# Patient Record
Sex: Female | Born: 1995 | Hispanic: No | Marital: Single | Smoking: Light tobacco smoker
Health system: Southern US, Community
[De-identification: ages and names within clinical notes are randomized; demographics above are authoritative.]

## PROBLEM LIST (undated history)

## (undated) ENCOUNTER — Inpatient Hospital Stay (HOSPITAL_COMMUNITY): Payer: Self-pay

## (undated) DIAGNOSIS — Z789 Other specified health status: Secondary | ICD-10-CM

## (undated) HISTORY — PX: WRIST SURGERY: SHX841

---
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
## 2013-11-29 ENCOUNTER — Encounter (HOSPITAL_COMMUNITY): Payer: Self-pay | Admitting: *Deleted

## 2013-11-29 ENCOUNTER — Inpatient Hospital Stay (HOSPITAL_COMMUNITY)
Admission: AD | Admit: 2013-11-29 | Discharge: 2013-11-30 | Disposition: A | Discharge status: Home / Self Care | Payer: 59 | Source: Ambulatory Visit | Attending: Family Medicine | Admitting: Family Medicine

## 2013-11-29 DIAGNOSIS — O3481 Maternal care for other abnormalities of pelvic organs, first trimester: Secondary | ICD-10-CM | POA: Diagnosis present

## 2013-11-29 DIAGNOSIS — D271 Benign neoplasm of left ovary: Secondary | ICD-10-CM | POA: Diagnosis not present

## 2013-11-29 DIAGNOSIS — N760 Acute vaginitis: Secondary | ICD-10-CM | POA: Diagnosis not present

## 2013-11-29 DIAGNOSIS — R109 Unspecified abdominal pain: Secondary | ICD-10-CM | POA: Insufficient documentation

## 2013-11-29 DIAGNOSIS — B9689 Other specified bacterial agents as the cause of diseases classified elsewhere: Secondary | ICD-10-CM | POA: Diagnosis not present

## 2013-11-29 DIAGNOSIS — O26891 Other specified pregnancy related conditions, first trimester: Secondary | ICD-10-CM

## 2013-11-29 DIAGNOSIS — A5901 Trichomonal vulvovaginitis: Secondary | ICD-10-CM | POA: Diagnosis not present

## 2013-11-29 DIAGNOSIS — A599 Trichomoniasis, unspecified: Secondary | ICD-10-CM

## 2013-11-29 DIAGNOSIS — R112 Nausea with vomiting, unspecified: Secondary | ICD-10-CM | POA: Diagnosis not present

## 2013-11-29 DIAGNOSIS — R102 Pelvic and perineal pain: Secondary | ICD-10-CM

## 2013-11-29 DIAGNOSIS — Z3A01 Less than 8 weeks gestation of pregnancy: Secondary | ICD-10-CM | POA: Insufficient documentation

## 2013-11-29 HISTORY — DX: Other specified health status: Z78.9

## 2013-11-29 LAB — URINALYSIS, ROUTINE W REFLEX MICROSCOPIC
Bilirubin Urine: NEGATIVE
Glucose, UA: NEGATIVE mg/dL
Ketones, ur: NEGATIVE mg/dL
NITRITE: NEGATIVE
PROTEIN: NEGATIVE mg/dL
SPECIFIC GRAVITY, URINE: 1.02 (ref 1.005–1.030)
UROBILINOGEN UA: 0.2 mg/dL (ref 0.0–1.0)
pH: 5.5 (ref 5.0–8.0)

## 2013-11-29 LAB — URINE MICROSCOPIC-ADD ON

## 2013-11-29 LAB — CBC
HEMATOCRIT: 34.8 % — AB (ref 36.0–46.0)
HEMOGLOBIN: 12 g/dL (ref 12.0–15.0)
MCH: 29.7 pg (ref 26.0–34.0)
MCHC: 34.5 g/dL (ref 30.0–36.0)
MCV: 86.1 fL (ref 78.0–100.0)
Platelets: 232 10*3/uL (ref 150–400)
RBC: 4.04 MIL/uL (ref 3.87–5.11)
RDW: 12.7 % (ref 11.5–15.5)
WBC: 10.5 10*3/uL (ref 4.0–10.5)

## 2013-11-29 LAB — POCT PREGNANCY, URINE: PREG TEST UR: POSITIVE — AB

## 2013-11-29 NOTE — MAU Provider Note (Signed)
History     CSN: [id number]  Arrival date and time: 11/29/13 2230   First Provider Initiated Contact with Patient 11/29/13 2351      Chief Complaint  Patient presents with  . Abdominal Pain  . Nausea   HPI  [name] is a 18 y.o. female G1P0 at [redacted]w[redacted]d who presents with abdominal pain and nausea. She has had abdominal pain times 1 week; the pain is sharp and all over her abdomen. The patient did not know she was pregnant and was informed in MAU.   OB History   Grav Para Term Preterm Abortions TAB SAB Ect Mult Living   1               Past Medical History  Diagnosis Date  . Medical history non-contributory     Past Surgical History  Procedure Laterality Date  . Wrist surgery      No family history on file.  History  Substance Use Topics  . Smoking status: Never Smoker   . Smokeless tobacco: Not on file  . Alcohol Use: Yes     Comment: occassional    Allergies: Allergies not on file  No prescriptions prior to admission   Results for orders placed during the hospital encounter of 11/29/13 (from the past 48 hour(s))  URINALYSIS, ROUTINE W REFLEX MICROSCOPIC     Status: Abnormal   Collection Time    11/29/13 10:50 PM      Result Value Ref Range   Color, Urine YELLOW  YELLOW   APPearance CLEAR  CLEAR   Specific Gravity, Urine 1.020  1.005 - 1.030   pH 5.5  5.0 - 8.0   Glucose, UA NEGATIVE  NEGATIVE mg/dL   Hgb urine dipstick LARGE (*) NEGATIVE   Bilirubin Urine NEGATIVE  NEGATIVE   Ketones, ur NEGATIVE  NEGATIVE mg/dL   Protein, ur NEGATIVE  NEGATIVE mg/dL   Urobilinogen, UA 0.2  0.0 - 1.0 mg/dL   Nitrite NEGATIVE  NEGATIVE   Leukocytes, UA SMALL (*) NEGATIVE  URINE MICROSCOPIC-ADD ON     Status: None   Collection Time    11/29/13 10:50 PM      Result Value Ref Range   Squamous Epithelial / LPF RARE  RARE   WBC, UA 0-2  <3 WBC/hpf   RBC / HPF 7-10  <3 RBC/hpf   Bacteria, UA RARE  RARE  POCT PREGNANCY, URINE     Status: Abnormal   Collection  Time    11/29/13 11:36 PM      Result Value Ref Range   Preg Test, Ur POSITIVE (*) NEGATIVE   Comment:            THE SENSITIVITY OF THIS     METHODOLOGY IS >24 mIU/mL  CBC     Status: Abnormal   Collection Time    11/29/13 11:45 PM      Result Value Ref Range   WBC 10.5  4.0 - 10.5 K/uL   RBC 4.04  3.87 - 5.11 MIL/uL   Hemoglobin 12.0  12.0 - 15.0 g/dL   HCT 34.8 (*) 36.0 - 46.0 %   MCV 86.1  78.0 - 100.0 fL   MCH 29.7  26.0 - 34.0 pg   MCHC 34.5  30.0 - 36.0 g/dL   RDW 12.7  11.5 - 15.5 %   Platelets 232  150 - 400 K/uL  ABO/RH     Status: None   Collection Time    11/29/13  11:45 PM      Result Value Ref Range   ABO/RH(D) O POS    HCG, QUANTITATIVE, PREGNANCY     Status: Abnormal   Collection Time    11/29/13 11:45 PM      Result Value Ref Range   hCG, Beta Chain, Quant, S 28910 (*) <5 mIU/mL   Comment:              GEST. AGE      CONC.  (mIU/mL)       <=1 WEEK        5 - 50         2 WEEKS       50 - 500         3 WEEKS       100 - 10,000         4 WEEKS     1,000 - 30,000         5 WEEKS     3,500 - 115,000       6-8 WEEKS     12,000 - 270,000        12 WEEKS     15,000 - 220,000                FEMALE AND NON-PREGNANT FEMALE:         LESS THAN 5 mIU/mL  WET PREP, GENITAL     Status: Abnormal   Collection Time    11/29/13 11:59 PM      Result Value Ref Range   Yeast Wet Prep HPF POC NONE SEEN  NONE SEEN   Trich, Wet Prep MODERATE (*) NONE SEEN   Clue Cells Wet Prep HPF POC MODERATE (*) NONE SEEN   WBC, Wet Prep HPF POC FEW (*) NONE SEEN   Comment: MANY BACTERIA SEEN    US Ob Comp Less 14 Wks  11/30/2013   CLINICAL DATA:  Pelvic pain in first trimester pregnancy. Initial encounter  EXAM: OBSTETRIC <14 WK [place] AND TRANSVAGINAL OB US  TECHNIQUE: Both transabdominal and transvaginal ultrasound examinations were performed for complete evaluation of the gestation as well as the maternal uterus, adnexal regions, and pelvic cul-de-sac. Transvaginal technique was  performed to assess early pregnancy.  COMPARISON:  None.  FINDINGS: Intrauterine gestational sac: Visualized/normal in shape.  Yolk sac:  Present  Embryo:  Present  Cardiac Activity: Present  Heart Rate:  117 bpm  CRL:   8.6  mm   6 w 6 d                  [place] EDC: 07/20/2014  Maternal uterus/adnexae: The right ovary is normal. The left ovary contains a echogenic mass with posterior acoustic shadowing measuring up to 3 cm. No free pelvic fluid or extra ovarian mass. The neighboring ovarian parenchyma does not appear edematous/enlarged. No a subchronic hemorrhage.  IMPRESSION: 1. Single, living intrauterine gestation with estimated age 11 weeks 6 days. No adverse gestational findings. 2. 3 cm mass in the left ovary consistent with dermoid.   Electronically Signed   By: Jorje Guild M.D.   On: 11/30/2013 00:50   US Ob Transvaginal  11/30/2013   CLINICAL DATA:  Pelvic pain in first trimester pregnancy. Initial encounter  EXAM: OBSTETRIC <14 WK [place] AND TRANSVAGINAL OB US  TECHNIQUE: Both transabdominal and transvaginal ultrasound examinations were performed for complete evaluation of the gestation as well as the maternal uterus, adnexal regions, and pelvic cul-de-sac. Transvaginal technique was performed to  assess early pregnancy.  COMPARISON:  None.  FINDINGS: Intrauterine gestational sac: Visualized/normal in shape.  Yolk sac:  Present  Embryo:  Present  Cardiac Activity: Present  Heart Rate:  117 bpm  CRL:   8.6  mm   6 w 6 d                  [place] EDC: 07/20/2014  Maternal uterus/adnexae: The right ovary is normal. The left ovary contains a echogenic mass with posterior acoustic shadowing measuring up to 3 cm. No free pelvic fluid or extra ovarian mass. The neighboring ovarian parenchyma does not appear edematous/enlarged. No a subchronic hemorrhage.  IMPRESSION: 1. Single, living intrauterine gestation with estimated age 55 weeks 6 days. No adverse gestational findings. 2. 3 cm mass in the left ovary consistent with  dermoid.   Electronically Signed   By: Jorje Guild M.D.   On: 11/30/2013 00:50      Review of Systems  Constitutional: Positive for chills. Negative for fever.  Gastrointestinal: Positive for nausea and abdominal pain. Negative for vomiting, diarrhea and constipation.  Genitourinary: Negative for dysuria, urgency, frequency and hematuria.       No vaginal discharge. + vaginal bleeding; one episode of spotting yesterday.  No dysuria.    Physical Exam   Blood pressure 129/74, pulse 73, temperature 98.6 F (37 C), resp. rate 18, height 5\' 5"  (1.651 m), weight 74.571 kg (164 lb 6.4 oz), last menstrual period 10/23/2013.  Physical Exam  Constitutional: She is oriented to person, place, and time. She appears well-developed and well-nourished. No distress.  HENT:  Head: Normocephalic.  Eyes: Pupils are equal, round, and reactive to light.  Neck: Neck supple.  Respiratory: Effort normal.  GI: Soft. She exhibits no distension. There is no tenderness. There is no rebound and no guarding.  Genitourinary:  Speculum exam: Vagina - Small amount of creamy, bubbly, white discharge, no odor Cervix - No contact bleeding Bimanual exam: Cervix closed Uterus non tender, normal size Adnexa non tender, no masses bilaterally GC/Chlam, wet prep done Chaperone present for exam.   Musculoskeletal: Normal range of motion.  Neurological: She is alert and oriented to person, place, and time.  Skin: Skin is warm. She is not diaphoretic.  Psychiatric: Her behavior is normal.    MAU Course  Procedures None  MDM CBC CMP HIV ABO Wet prep GC [place]   Assessment and Plan   A: -[redacted]w[redacted]d SIUP -3 cm left ovarian dermoid cyst  -abdominal pain in pregnancy  -Trichomonas  -bacterial vaginosis   P: Discharge home in stable condition  RX: Flagyl times 7 days Return to MAU as needed, if symptoms worsen Start prenatal care ASAP  Patient needs treatment; no intercourse for 7 days once partner is  treated.   Darrelyn Hillock Rasch, NP 11/30/2013 1:01 AM

## 2013-11-29 NOTE — MAU Note (Signed)
For past wk I have felt really nauseated but can't throw up. Abdominal pains for a wk. Spotted one time yesterday.

## 2013-11-30 ENCOUNTER — Encounter (HOSPITAL_COMMUNITY): Payer: Self-pay | Admitting: *Deleted

## 2013-11-30 ENCOUNTER — Inpatient Hospital Stay (HOSPITAL_COMMUNITY): Payer: 59

## 2013-11-30 LAB — WET PREP, GENITAL: Yeast Wet Prep HPF POC: NONE SEEN

## 2013-11-30 LAB — ABO/RH: ABO/RH(D): O POS

## 2013-11-30 LAB — GC/CHLAMYDIA PROBE AMP
CT Probe RNA: NEGATIVE
GC Probe RNA: POSITIVE — AB

## 2013-11-30 LAB — HIV ANTIBODY (ROUTINE TESTING W REFLEX): HIV 1&2 Ab, 4th Generation: NONREACTIVE

## 2013-11-30 LAB — HCG, QUANTITATIVE, PREGNANCY: hCG, Beta Chain, Quant, S: 28910 m[IU]/mL — ABNORMAL HIGH (ref <5)

## 2013-11-30 MED ORDER — METRONIDAZOLE 500 MG PO TABS
500.0000 mg | ORAL_TABLET | Freq: Two times a day (BID) | ORAL | Status: DC
Start: 2013-11-30 — End: 2014-03-20

## 2013-11-30 NOTE — MAU Provider Note (Signed)
Attestation of Attending Supervision of Advanced Practitioner (PA/CNM/NP): Evaluation and management procedures were performed by the Advanced Practitioner under my supervision and collaboration.  I have reviewed the Advanced Practitioner's note and chart, and I agree with the management and plan.  Takasha Vetere, DO Attending [facility], [facility]  

## 2013-12-02 ENCOUNTER — Encounter (HOSPITAL_COMMUNITY): Payer: Self-pay | Admitting: *Deleted

## 2013-12-16 ENCOUNTER — Encounter (HOSPITAL_COMMUNITY): Payer: Self-pay | Admitting: *Deleted

## 2014-02-11 LAB — OB RESULTS CONSOLE ABO/RH: RH Type: POSITIVE

## 2014-02-11 LAB — OB RESULTS CONSOLE RPR: RPR: NONREACTIVE

## 2014-02-11 LAB — OB RESULTS CONSOLE HEPATITIS B SURFACE ANTIGEN: Hepatitis B Surface Ag: NEGATIVE

## 2014-02-11 LAB — OB RESULTS CONSOLE ANTIBODY SCREEN: Antibody Screen: NEGATIVE

## 2014-02-11 LAB — OB RESULTS CONSOLE RUBELLA ANTIBODY, IGM: RUBELLA: IMMUNE

## 2014-02-11 LAB — OB RESULTS CONSOLE GC/CHLAMYDIA
Chlamydia: NEGATIVE
Gonorrhea: NEGATIVE

## 2014-02-14 NOTE — L&D Delivery Note (Signed)
Delivery Note Patient pushed for 45 minutes after she was noted to be C/C/+2. At [place] a viable and healthy female was delivered via  (Presentation:ROA  ).  APGAR:9/9 weight  pending   Placenta status: intact Cord:  with the following complications: none  The shoulders and body easily delivered. The placenta was delivered spontaneously.  Uterus was firm after delivery with no bleeding. No vaginal or perineal lacs.   Anesthesia: Epidural  Episiotomy:  none Lacerations:  none  Est. Blood Loss (mL):  134mL   Mom to postpartum.  Baby to Couplet care / Skin to Skin.  Johm Pfannenstiel, [place] 07/18/2014, 7:00 PM

## 2014-03-20 ENCOUNTER — Inpatient Hospital Stay (HOSPITAL_COMMUNITY)
Admission: AD | Admit: 2014-03-20 | Discharge: 2014-03-20 | Disposition: A | Discharge status: Home / Self Care | Payer: Medicaid Other | Source: Ambulatory Visit | Attending: Obstetrics and Gynecology | Admitting: Obstetrics and Gynecology

## 2014-03-20 ENCOUNTER — Encounter (HOSPITAL_COMMUNITY): Payer: Self-pay | Admitting: *Deleted

## 2014-03-20 DIAGNOSIS — M545 Low back pain, unspecified: Secondary | ICD-10-CM

## 2014-03-20 DIAGNOSIS — O26892 Other specified pregnancy related conditions, second trimester: Secondary | ICD-10-CM

## 2014-03-20 DIAGNOSIS — O9989 Other specified diseases and conditions complicating pregnancy, childbirth and the puerperium: Secondary | ICD-10-CM | POA: Insufficient documentation

## 2014-03-20 DIAGNOSIS — Z3A22 22 weeks gestation of pregnancy: Secondary | ICD-10-CM | POA: Insufficient documentation

## 2014-03-20 DIAGNOSIS — Z3A23 23 weeks gestation of pregnancy: Secondary | ICD-10-CM

## 2014-03-20 DIAGNOSIS — O2692 Pregnancy related conditions, unspecified, second trimester: Secondary | ICD-10-CM

## 2014-03-20 LAB — URINALYSIS, ROUTINE W REFLEX MICROSCOPIC
Bilirubin Urine: NEGATIVE
GLUCOSE, UA: NEGATIVE mg/dL
HGB URINE DIPSTICK: NEGATIVE
Ketones, ur: 15 mg/dL — AB
LEUKOCYTES UA: NEGATIVE
NITRITE: NEGATIVE
PH: 6 (ref 5.0–8.0)
Protein, ur: NEGATIVE mg/dL
Specific Gravity, Urine: >1.03 — ABNORMAL HIGH (ref 1.005–1.030)
Urobilinogen, UA: 0.2 mg/dL (ref 0.0–1.0)

## 2014-03-20 MED ORDER — CYCLOBENZAPRINE HCL 10 MG PO TABS: 10.0000 mg | ORAL_TABLET | Freq: Every day | ORAL | Status: DC

## 2014-03-20 NOTE — Discharge Instructions (Signed)
Back Exercises Back exercises help treat and prevent back injuries. The goal of back exercises is to increase the strength of your abdominal and back muscles and the flexibility of your back. These exercises should be started when you no longer have back pain. Back exercises include:  Pelvic Tilt. Lie on your back with your knees bent. Tilt your pelvis until the lower part of your back is against the floor. Hold this position 5 to 10 sec and repeat 5 to 10 times.  Knee to Chest. Pull first 1 knee up against your chest and hold for 20 to 30 seconds, repeat this with the other knee, and then both knees. This may be done with the other leg straight or bent, whichever feels better.  Sit-Ups or Curl-Ups. Bend your knees 90 degrees. Start with tilting your pelvis, and do a partial, slow sit-up, lifting your trunk only 30 to 45 degrees off the floor. Take at least 2 to 3 seconds for each sit-up. Do not do sit-ups with your knees out straight. If partial sit-ups are difficult, simply do the above but with only tightening your abdominal muscles and holding it as directed.  Hip-Lift. Lie on your back with your knees flexed 90 degrees. Push down with your feet and shoulders as you raise your hips a couple inches off the floor; hold for 10 seconds, repeat 5 to 10 times.  Back arches. Lie on your stomach, propping yourself up on bent elbows. Slowly press on your hands, causing an arch in your low back. Repeat 3 to 5 times. Any initial stiffness and discomfort should lessen with repetition over time.  Shoulder-Lifts. Lie face down with arms beside your body. Keep hips and torso pressed to floor as you slowly lift your head and shoulders off the floor. Do not overdo your exercises, especially in the beginning. Exercises may cause you some mild back discomfort which lasts for a few minutes; however, if the pain is more severe, or lasts for more than 15 minutes, do not continue exercises until you see your caregiver.  Improvement with exercise therapy for back problems is slow.  See your caregivers for assistance with developing a proper back exercise program. Document Released: 03/10/2004 Document Revised: 04/25/2011 Document Reviewed: 12/02/2010 [facility] Patient Information 2015 [place], [place]. This information is not intended to replace advice given to you by your health care provider. Make sure you discuss any questions you have with your health care provider. Back Pain in Pregnancy Back pain during pregnancy is common. It happens in about half of all pregnancies. It is important for you and your baby that you remain active during your pregnancy.If you feel that back pain is not allowing you to remain active or sleep well, it is time to see your caregiver. Back pain may be caused by several factors related to changes during your pregnancy.Fortunately, unless you had trouble with your back before your pregnancy, the pain is likely to get better after you deliver. Low back pain usually occurs between the fifth and seventh months of pregnancy. It can, however, happen in the first couple months. Factors that increase the risk of back problems include:   Previous back problems.  Injury to your back.  Having twins or multiple births.  A chronic cough.  Stress.  Job-related repetitive motions.  Muscle or spinal disease in the back.  Family history of back problems, ruptured (herniated) discs, or osteoporosis.  Depression, anxiety, and panic attacks. CAUSES   When you are pregnant, your body  produces a hormone called relaxin. This hormonemakes the ligaments connecting the low back and pubic bones more flexible. This flexibility allows the baby to be delivered more easily. When your ligaments are loose, your muscles need to work harder to support your back. Soreness in your back can come from tired muscles. Soreness can also come from back tissues that are irritated since they are receiving less  support.  As the baby grows, it puts pressure on the nerves and blood vessels in your pelvis. This can cause back pain.  As the baby grows and gets heavier during pregnancy, the uterus pushes the stomach muscles forward and changes your center of gravity. This makes your back muscles work harder to maintain good posture. SYMPTOMS  Lumbar pain during pregnancy Lumbar pain during pregnancy usually occurs at or above the waist in the center of the back. There may be pain and numbness that radiates into your leg or foot. This is similar to low back pain experienced by non-pregnant women. It usually increases with sitting for long periods of time, standing, or repetitive lifting. Tenderness may also be present in the muscles along your upper back. Posterior pelvic pain during pregnancy Pain in the back of the pelvis is more common than lumbar pain in pregnancy. It is a deep pain felt in your side at the waistline, or across the tailbone (sacrum), or in both places. You may have pain on one or both sides. This pain can also go into the buttocks and backs of the upper thighs. Pubic and groin pain may also be present. The pain does not quickly resolve with rest, and morning stiffness may also be present. Pelvic pain during pregnancy can be brought on by most activities. A high level of fitness before and during pregnancy may or may not prevent this problem. Labor pain is usually 1 to 2 minutes apart, lasts for about 1 minute, and involves a bearing down feeling or pressure in your pelvis. However, if you are at term with the pregnancy, constant low back pain can be the beginning of early labor, and you should be aware of this. DIAGNOSIS  X-rays of the back should not be done during the first 12 to 14 weeks of the pregnancy and only when absolutely necessary during the rest of the pregnancy. MRIs do not give off radiation and are safe during pregnancy. MRIs also should only be done when absolutely  necessary. HOME CARE INSTRUCTIONS  Exercise as directed by your caregiver. Exercise is the most effective way to prevent or manage back pain. If you have a back problem, it is especially important to avoid sports that require sudden body movements. Swimming and walking are great activities.  Do not stand in one place for long periods of time.  Do not wear high heels.  Sit in chairs with good posture. Use a pillow on your lower back if necessary. Make sure your head rests over your shoulders and is not hanging forward.  Try sleeping on your side, preferably the left side, with a pillow or two between your legs. If you are sore after a night's rest, your bedmay betoo soft.Try placing a board between your mattress and box spring.  Listen to your body when lifting.If you are experiencing pain, ask for help or try bending yourknees more so you can use your leg muscles rather than your back muscles. Squat down when picking up something from the floor. Do not bend over.  Eat a healthy diet. Try to gain  weight within your caregiver's recommendations.  Use heat or cold packs 3 to 4 times a day for 15 minutes to help with the pain.  Only take over-the-counter or prescription medicines for pain, discomfort, or fever as directed by your caregiver. Sudden (acute) back pain  Use bed rest for only the most extreme, acute episodes of back pain. Prolonged bed rest over 48 hours will aggravate your condition.  Ice is very effective for acute conditions.  Put ice in a plastic bag.  Place a towel between your skin and the bag.  Leave the ice on for 10 to 20 minutes every 2 hours, or as needed.  Using heat packs for 30 minutes prior to activities is also helpful. Continued back pain See your caregiver if you have continued problems. Your caregiver can help or refer you for appropriate physical therapy. With conditioning, most back problems can be avoided. Sometimes, a more serious issue may be the  cause of back pain. You should be seen right away if new problems seem to be developing. Your caregiver may recommend:  A maternity girdle.  An elastic sling.  A back brace.  A massage therapist or acupuncture. SEEK MEDICAL CARE IF:   You are not able to do most of your daily activities, even when taking the pain medicine you were given.  You need a referral to a physical therapist or chiropractor.  You want to try acupuncture. SEEK IMMEDIATE MEDICAL CARE IF:  You develop numbness, tingling, weakness, or problems with the use of your arms or legs.  You develop severe back pain that is no longer relieved with medicines.  You have a sudden change in bowel or bladder control.  You have increasing pain in other areas of the body.  You develop shortness of breath, dizziness, or fainting.  You develop nausea, vomiting, or sweating.  You have back pain which is similar to labor pains.  You have back pain along with your water breaking or vaginal bleeding.  You have back pain or numbness that travels down your leg.  Your back pain developed after you fell.  You develop pain on one side of your back. You may have a kidney stone.  You see blood in your urine. You may have a bladder infection or kidney stone.  You have back pain with blisters. You may have shingles. Back pain is fairly common during pregnancy but should not be accepted as just part of the process. Back pain should always be treated as soon as possible. This will make your pregnancy as pleasant as possible. Document Released: 05/11/2005 Document Revised: 04/25/2011 Document Reviewed: 06/22/2010 [facility] Patient Information 2015 [place], [place]. This information is not intended to replace advice given to you by your health care provider. Make sure you discuss any questions you have with your health care provider.

## 2014-03-20 NOTE — MAU Provider Note (Signed)
History     CSN: [id number]  Arrival date and time: 03/20/14 1609   First Provider Initiated Contact with Patient 03/20/14 1833      Chief Complaint  Patient presents with  . Back Pain   HPI Comments: [place] 19 y.o. G1P0 [redacted]w[redacted]d complains of low back pains that are ongoing off and on for 2 weeks. She has used tylenol with little help. 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 She denies any sx of UTI. She has a maternity belt and wears it  Back Pain      Past Medical History  Diagnosis Date  . Medical history non-contributory     Past Surgical History  Procedure Laterality Date  . Wrist surgery      History reviewed. No pertinent family history.  History  Substance Use Topics  . Smoking status: Never Smoker   . Smokeless tobacco: Not on file  . Alcohol Use: Yes     Comment: occassional    Allergies: No Known Allergies  Prescriptions prior to admission  Medication Sig Dispense Refill Last Dose  . Prenatal Vit-Fe Fumarate-FA (PRENATAL MULTIVITAMIN) TABS tablet Take 1 tablet by mouth daily at 12 noon.   Past Week at Unknown time  . metroNIDAZOLE (FLAGYL) 500 MG tablet Take 1 tablet (500 mg total) by mouth 2 (two) times daily. (Patient not taking: Reported on 03/20/2014) 14 tablet 0     Review of Systems  Constitutional: Negative.   HENT: Negative.   Eyes: Negative.   Cardiovascular: Negative.   Gastrointestinal: Negative.   Genitourinary: Negative.   Musculoskeletal: Positive for back pain.  Skin: Negative.   Neurological: Negative.   Psychiatric/Behavioral: Negative.    Physical Exam   Blood pressure 121/70, pulse 64, resp. rate 18, height 5' 4.17" (1.63 m), weight 84.482 kg (186 lb 4 oz), last menstrual period 10/23/2013, SpO2 100 %.  Physical Exam  Constitutional: She is oriented to person, place, and time. She appears well-developed and well-nourished. No distress.  HENT:  Head: Normocephalic and  atraumatic.  Eyes: Pupils are equal, round, and reactive to light.  Musculoskeletal:  Muscle tenderness in low back regions  Neurological: She is alert and oriented to person, place, and time.  Skin: Skin is warm and dry.  Psychiatric: She has a normal mood and affect. Her behavior is normal. Judgment and thought content normal.     Results for orders placed or performed during the hospital encounter of 03/20/14 (from the past 24 hour(s))  Urinalysis, Routine w reflex microscopic     Status: Abnormal   Collection Time: 03/20/14  6:18 PM  Result Value Ref Range   Color, Urine YELLOW YELLOW   APPearance CLEAR CLEAR   Specific Gravity, Urine >1.030 (H) 1.005 - 1.030   pH 6.0 5.0 - 8.0   Glucose, UA NEGATIVE NEGATIVE mg/dL   Hgb urine dipstick NEGATIVE NEGATIVE   Bilirubin Urine NEGATIVE NEGATIVE   Ketones, ur 15 (A) NEGATIVE mg/dL   Protein, ur NEGATIVE NEGATIVE mg/dL   Urobilinogen, UA 0.2 0.0 - 1.0 mg/dL   Nitrite NEGATIVE NEGATIVE   Leukocytes, UA NEGATIVE NEGATIVE    MAU Course  Procedures  MDM  Spoke with Dr Harrington Challenger who agreed she may have Flexeril for home  Assessment and Plan   A: Low back pains in pregnancy  P: Advised to increase fluids Advised warm baths and frequent rest periods Flexeril 10 mg po qhs prn Follow up  with Dr Harrington Challenger / MAU as needed    Georgia Duff 03/20/2014, 6:55 PM

## 2014-03-20 NOTE — MAU Note (Signed)
Pt reports she has had back pain for the past two weeks. Pt reports pain radiates towards he left side. "Feels like her back gets weak and can't support her."

## 2014-04-28 ENCOUNTER — Inpatient Hospital Stay (HOSPITAL_COMMUNITY)
Admission: AD | Admit: 2014-04-28 | Discharge: 2014-04-28 | Disposition: A | Discharge status: Home / Self Care | Payer: Medicaid Other | Source: Ambulatory Visit | Attending: Obstetrics and Gynecology | Admitting: Obstetrics and Gynecology

## 2014-04-28 ENCOUNTER — Encounter (HOSPITAL_COMMUNITY): Payer: Self-pay | Admitting: *Deleted

## 2014-04-28 DIAGNOSIS — M549 Dorsalgia, unspecified: Secondary | ICD-10-CM | POA: Diagnosis not present

## 2014-04-28 DIAGNOSIS — O9989 Other specified diseases and conditions complicating pregnancy, childbirth and the puerperium: Secondary | ICD-10-CM

## 2014-04-28 DIAGNOSIS — O2343 Unspecified infection of urinary tract in pregnancy, third trimester: Secondary | ICD-10-CM | POA: Insufficient documentation

## 2014-04-28 DIAGNOSIS — O26893 Other specified pregnancy related conditions, third trimester: Secondary | ICD-10-CM

## 2014-04-28 DIAGNOSIS — Z3A28 28 weeks gestation of pregnancy: Secondary | ICD-10-CM | POA: Insufficient documentation

## 2014-04-28 DIAGNOSIS — N390 Urinary tract infection, site not specified: Secondary | ICD-10-CM

## 2014-04-28 LAB — URINE MICROSCOPIC-ADD ON

## 2014-04-28 LAB — URINALYSIS, ROUTINE W REFLEX MICROSCOPIC
Bilirubin Urine: NEGATIVE
GLUCOSE, UA: NEGATIVE mg/dL
Hgb urine dipstick: NEGATIVE
Ketones, ur: NEGATIVE mg/dL
Nitrite: POSITIVE — AB
PH: 6.5 (ref 5.0–8.0)
Protein, ur: NEGATIVE mg/dL
Specific Gravity, Urine: <1.005 — ABNORMAL LOW (ref 1.005–1.030)
Urobilinogen, UA: 0.2 mg/dL (ref 0.0–1.0)

## 2014-04-28 MED ORDER — CYCLOBENZAPRINE HCL 10 MG PO TABS: 10.0000 mg | ORAL_TABLET | Freq: Every day | ORAL | Status: DC

## 2014-04-28 MED ORDER — NITROFURANTOIN MONOHYD MACRO 100 MG PO CAPS: 100.0000 mg | ORAL_CAPSULE | Freq: Two times a day (BID) | ORAL | Status: DC

## 2014-04-28 NOTE — Discharge Instructions (Signed)

## 2014-04-28 NOTE — MAU Note (Signed)
Pt states here for pain on her left side of lower abdomen only, and all across low back. Pain x2 weeks. Called MD office and was told to come for eval. Constant pain. No abnormal vaginal discharge or bleeding.

## 2014-04-28 NOTE — MAU Note (Signed)
Urine in lab 

## 2014-04-28 NOTE — MAU Provider Note (Signed)
History     CSN: [id number]  Arrival date and time: 04/28/14 1340   None     Chief Complaint  Patient presents with  . Abdominal Pain  . Back Pain   HPI    [name] is a 19 y.o.female G1P0 at [redacted]w[redacted]d who presents with back pain that has been going on for several weeks; she was here at 22 weeks with similar symptoms. She was given flexeril and does not feel that it helps enough.   She denies vaginal bleeding or leaking fluids + fetal movement   OB History    Gravida Para Term Preterm AB TAB SAB Ectopic Multiple Living   1               Past Medical History  Diagnosis Date  . Medical history non-contributory     Past Surgical History  Procedure Laterality Date  . Wrist surgery      History reviewed. No pertinent family history.  History  Substance Use Topics  . Smoking status: Never Smoker   . Smokeless tobacco: Not on file  . Alcohol Use: Yes     Comment: occassional    Allergies: No Known Allergies  Prescriptions prior to admission  Medication Sig Dispense Refill Last Dose  . cyclobenzaprine (FLEXERIL) 10 MG tablet Take 1 tablet (10 mg total) by mouth at bedtime. 30 tablet 0 Past Month at Unknown time  . Prenatal Vit-Fe Fumarate-FA (PRENATAL MULTIVITAMIN) TABS tablet Take 1 tablet by mouth daily at 12 noon.   Past Month at Unknown time   Results for orders placed or performed during the hospital encounter of 04/28/14 (from the past 48 hour(s))  Urinalysis, Routine w reflex microscopic     Status: Abnormal   Collection Time: 04/28/14  1:44 PM  Result Value Ref Range   Color, Urine YELLOW YELLOW   APPearance HAZY (A) CLEAR   Specific Gravity, Urine <1.005 (L) 1.005 - 1.030   pH 6.5 5.0 - 8.0   Glucose, UA NEGATIVE NEGATIVE mg/dL   Hgb urine dipstick NEGATIVE NEGATIVE   Bilirubin Urine NEGATIVE NEGATIVE   Ketones, ur NEGATIVE NEGATIVE mg/dL   Protein, ur NEGATIVE NEGATIVE mg/dL   Urobilinogen, UA 0.2 0.0 - 1.0 mg/dL   Nitrite POSITIVE (A)  NEGATIVE   Leukocytes, UA SMALL (A) NEGATIVE  Urine microscopic-add on     Status: Abnormal   Collection Time: 04/28/14  1:44 PM  Result Value Ref Range   Squamous Epithelial / LPF FEW (A) RARE   WBC, UA 3-6 <3 WBC/hpf   RBC / HPF 0-2 <3 RBC/hpf   Bacteria, UA FEW (A) RARE      Review of Systems  Constitutional: Negative for fever and chills.  Gastrointestinal: Negative for abdominal pain.  Genitourinary: Positive for frequency. Negative for dysuria and urgency.  Musculoskeletal: Positive for back pain (Bilateral lower back pain ).   Physical Exam   Blood pressure 110/68, pulse 90, temperature 98.1 F (36.7 C), resp. rate 20, height 5' 4.25" (1.632 m), weight 92.534 kg (204 lb), last menstrual period 10/23/2013.  Physical Exam  Constitutional: She appears well-developed and well-nourished.  Non-toxic appearance. She does not have a sickly appearance. She does not appear ill. No distress.  HENT:  Head: Normocephalic.  Eyes: Pupils are equal, round, and reactive to light.  Neck: Neck supple.  GI: Soft. There is no CVA tenderness.  Genitourinary:  Dilation: Closed Effacement (%): Thick Cervical Position: Posterior Exam by:: Bentli Llorente  Skin: She is  not diaphoretic.   Fetal Tracing: Baseline: 145 bpm  Variability: Moderate  Accelerations: 15x15 Decelerations: quick variables  Toco: None  MAU Course  Procedures None  MDM Urine culture pending  Discussed Patient with Dr. Rogue Bussing   Assessment and Plan   A:  1. Back pain affecting pregnancy in third trimester   2. UTI (lower urinary tract infection)     P:  Discharge home in stable condition Pregnancy support belt encouraged RX: Macrobid, flexeril Return to MAU if symptoms worsen Follow up with OB as scheduled.     Lezlie Lye, NP 04/28/2014 3:43 PM

## 2014-04-29 LAB — URINE CULTURE
Colony Count: >=100000
Special Requests: NORMAL

## 2014-07-04 ENCOUNTER — Inpatient Hospital Stay (HOSPITAL_COMMUNITY)
Admission: AD | Admit: 2014-07-04 | Discharge: 2014-07-04 | Disposition: A | Discharge status: Home / Self Care | Payer: Medicaid Other | Source: Ambulatory Visit | Attending: Obstetrics and Gynecology | Admitting: Obstetrics and Gynecology

## 2014-07-04 ENCOUNTER — Encounter (HOSPITAL_COMMUNITY): Payer: Self-pay | Admitting: *Deleted

## 2014-07-04 DIAGNOSIS — O9989 Other specified diseases and conditions complicating pregnancy, childbirth and the puerperium: Secondary | ICD-10-CM | POA: Insufficient documentation

## 2014-07-04 DIAGNOSIS — Z3A37 37 weeks gestation of pregnancy: Secondary | ICD-10-CM | POA: Diagnosis not present

## 2014-07-04 DIAGNOSIS — N949 Unspecified condition associated with female genital organs and menstrual cycle: Secondary | ICD-10-CM

## 2014-07-04 DIAGNOSIS — R102 Pelvic and perineal pain: Secondary | ICD-10-CM | POA: Insufficient documentation

## 2014-07-04 DIAGNOSIS — M545 Low back pain: Secondary | ICD-10-CM | POA: Diagnosis present

## 2014-07-04 LAB — URINALYSIS, ROUTINE W REFLEX MICROSCOPIC
Bilirubin Urine: NEGATIVE
GLUCOSE, UA: NEGATIVE mg/dL
Ketones, ur: NEGATIVE mg/dL
LEUKOCYTES UA: NEGATIVE
Nitrite: NEGATIVE
Protein, ur: NEGATIVE mg/dL
SPECIFIC GRAVITY, URINE: 1.01 (ref 1.005–1.030)
UROBILINOGEN UA: 0.2 mg/dL (ref 0.0–1.0)
pH: 6 (ref 5.0–8.0)

## 2014-07-04 LAB — URINE MICROSCOPIC-ADD ON

## 2014-07-04 MED ORDER — ACETAMINOPHEN 325 MG PO TABS: 650.0000 mg | ORAL_TABLET | Freq: Once | ORAL | Status: DC

## 2014-07-04 MED ORDER — ACETAMINOPHEN 500 MG PO TABS
1000.0000 mg | ORAL_TABLET | Freq: Once | ORAL | Status: AC
  Administered 2014-07-04: 1000 mg via ORAL
  Filled 2014-07-04: qty 2

## 2014-07-04 NOTE — MAU Note (Signed)
C/o constant, lower, sharp aching back pain  for past 2 days; "very tired of being pregnant'; broke down and cried;

## 2014-07-04 NOTE — MAU Provider Note (Signed)
Chief Complaint: Back Pain   First Provider Initiated Contact with Patient 07/04/14 1812     SUBJECTIVE HPI: Penny Leonard is a 19 y.o. G1P0 at [redacted]w[redacted]d by LMP who presents with 2 day history of sharp mid low back pain. The pain is constant but waxes and wanes. It is worse in certain positions and with walking. She has had no previous similar episodes. Pain does not radiate but yesterday she had a fleeting sharp pain in left groin along with the back pain. She has not tried anything for pain relief.  Good fetal movement. No vaginal bleeding. No leakage of fluid.  Pregnancy course: Obesity; essentially uncomplicated  Past Medical History  Diagnosis Date  . Medical history non-contributory    OB History  Gravida Para Term Preterm AB SAB TAB Ectopic Multiple Living  1             # Outcome Date GA Lbr Len/2nd Weight Sex Delivery Anes PTL Lv  1 Current              Past Surgical History  Procedure Laterality Date  . Wrist surgery     History   Social History  . Marital Status: Single    Spouse Name: N/A  . Number of Children: N/A  . Years of Education: N/A   Occupational History  . Not on file.   Social History Main Topics  . Smoking status: Never Smoker   . Smokeless tobacco: Not on file  . Alcohol Use: Yes     Comment: occassional  . Drug Use: Yes    Special: Marijuana     Comment: last used 2 weeks ago  . Sexual Activity: Yes    Birth Control/ Protection: Condom   Other Topics Concern  . Not on file   Social History Narrative   No current facility-administered medications on file prior to encounter.   Current Outpatient Prescriptions on File Prior to Encounter  Medication Sig Dispense Refill  . cyclobenzaprine (FLEXERIL) 10 MG tablet Take 1 tablet (10 mg total) by mouth at bedtime. 30 tablet 0  . Prenatal Vit-Fe Fumarate-FA (PRENATAL MULTIVITAMIN) TABS tablet Take 1 tablet by mouth daily at 12 noon.    . nitrofurantoin, macrocrystal-monohydrate, (MACROBID) 100  MG capsule Take 1 capsule (100 mg total) by mouth 2 (two) times daily. (Patient not taking: Reported on 07/04/2014) 10 capsule 0   No Known Allergies  Review of Systems  Constitutional: Negative for fever and chills.  Cardiovascular: Negative for chest pain.  Gastrointestinal: Negative for nausea and vomiting.  Genitourinary: Negative for dysuria, urgency, frequency, hematuria and flank pain.  Neurological: Negative for headaches.    OBJECTIVE Filed Vitals:   07/04/14 1921  BP: 132/83  Pulse: 97  Temp: 97.9 F (36.6 C)  TempSrc: Oral  Resp: 16   GENERAL: Obese female in no acute distress.  HEART: normal rate RESP: normal effort GI: Abdomen soft, abd and groin regions non-tender. S=D BACK: neg CVAT; minimal TTP L-S paraspinous region MS: Nontender, no edema NEURO: Alert and oriented SVE: posterior/L/C/H   EFM: Baseline 140, moderate variability, accelerations present, no decelerations Toco: No contractions  LAB RESULTS Results for orders placed or performed during the hospital encounter of 07/04/14 (from the past 24 hour(s))  Urinalysis, Routine w reflex microscopic     Status: Abnormal   Collection Time: 07/04/14  5:41 PM  Result Value Ref Range   Color, Urine YELLOW YELLOW   APPearance CLEAR CLEAR   Specific Gravity, Urine  1.010 1.005 - 1.030   pH 6.0 5.0 - 8.0   Glucose, UA NEGATIVE NEGATIVE mg/dL   Hgb urine dipstick SMALL (A) NEGATIVE   Bilirubin Urine NEGATIVE NEGATIVE   Ketones, ur NEGATIVE NEGATIVE mg/dL   Protein, ur NEGATIVE NEGATIVE mg/dL   Urobilinogen, UA 0.2 0.0 - 1.0 mg/dL   Nitrite NEGATIVE NEGATIVE   Leukocytes, UA NEGATIVE NEGATIVE  Urine microscopic-add on     Status: Abnormal   Collection Time: 07/04/14  5:41 PM  Result Value Ref Range   Squamous Epithelial / LPF RARE RARE   WBC, UA 0-2 <3 WBC/hpf   RBC / HPF 3-6 <3 RBC/hpf   Bacteria, UA FEW (A) RARE    IMAGING No results found.  MAU COURSE Acetaminophen 1000mg  po > pain relieved  rates 1/10 C/W Dr. Rogue Bussing who agrees with plan  ASSESSMENT 1. Round ligament pain   G1 at [redacted]w[redacted]d Reactive EFM  PLAN Discharge home with information on RLP relief measures Tylenol up to 3 gm/d prn   Medication List    STOP taking these medications        cyclobenzaprine 10 MG tablet  Commonly known as:  FLEXERIL     nitrofurantoin (macrocrystal-monohydrate) 100 MG capsule  Commonly known as:  MACROBID      TAKE these medications        calcium carbonate 500 MG chewable tablet  Commonly known as:  TUMS - dosed in mg elemental calcium  Chew 2 tablets by mouth daily as needed for indigestion or heartburn.     prenatal multivitamin Tabs tablet  Take 1 tablet by mouth daily at 12 noon.       Follow-up Information    Follow up with Allyn Kenner, DO On 07/09/2014.   Specialty:  Obstetrics and Gynecology   Why:  Keep your scheduled prenatal appointment   Contact information:   [place] [address] [phone]       Lorene Dy, [place]ta [place] 07/04/2014  6:25 PM

## 2014-07-04 NOTE — MAU Note (Signed)
Consistent pain in low back for past 2 days. No bleeding or leaking. Has not been dilated. Denies problems with pregnancy

## 2014-07-04 NOTE — Discharge Instructions (Signed)

## 2014-07-08 LAB — OB RESULTS CONSOLE GBS: STREP GROUP B AG: NEGATIVE

## 2014-07-17 ENCOUNTER — Encounter (HOSPITAL_COMMUNITY): Payer: Self-pay | Admitting: *Deleted

## 2014-07-17 ENCOUNTER — Inpatient Hospital Stay (HOSPITAL_COMMUNITY)
Admission: AD | Admit: 2014-07-17 | Discharge: 2014-07-17 | Disposition: A | Discharge status: Home / Self Care | Payer: Medicaid Other | Source: Ambulatory Visit | Attending: Obstetrics and Gynecology | Admitting: Obstetrics and Gynecology

## 2014-07-17 NOTE — Progress Notes (Addendum)
Contractions every 3-5 minutes. Denies bright red vaginal bleeding.   Positive fetal movement DeniesSROM/LOF  Denies any infections/complications of pregnancy  GBS negative per patient No Prenatal record on chart since 3/18

## 2014-07-18 ENCOUNTER — Inpatient Hospital Stay (HOSPITAL_COMMUNITY): Payer: Medicaid Other | Admitting: Anesthesiology

## 2014-07-18 ENCOUNTER — Encounter (HOSPITAL_COMMUNITY): Payer: Self-pay

## 2014-07-18 ENCOUNTER — Inpatient Hospital Stay (HOSPITAL_COMMUNITY)
Admission: AD | Admit: 2014-07-18 | Discharge: 2014-07-20 | DRG: 775 | Disposition: A | Discharge status: Home / Self Care | Payer: Medicaid Other | Source: Ambulatory Visit | Attending: Obstetrics & Gynecology | Admitting: Obstetrics & Gynecology

## 2014-07-18 DIAGNOSIS — IMO0001 Reserved for inherently not codable concepts without codable children: Secondary | ICD-10-CM

## 2014-07-18 DIAGNOSIS — Z3A40 40 weeks gestation of pregnancy: Secondary | ICD-10-CM | POA: Diagnosis present

## 2014-07-18 DIAGNOSIS — F1210 Cannabis abuse, uncomplicated: Secondary | ICD-10-CM | POA: Diagnosis present

## 2014-07-18 DIAGNOSIS — O99324 Drug use complicating childbirth: Secondary | ICD-10-CM | POA: Diagnosis present

## 2014-07-18 LAB — CBC
HCT: 34.8 % — ABNORMAL LOW (ref 36.0–46.0)
HEMOGLOBIN: 10.9 g/dL — AB (ref 12.0–15.0)
MCH: 26.1 pg (ref 26.0–34.0)
MCHC: 31.3 g/dL (ref 30.0–36.0)
MCV: 83.3 fL (ref 78.0–100.0)
PLATELETS: 234 10*3/uL (ref 150–400)
RBC: 4.18 MIL/uL (ref 3.87–5.11)
RDW: 15.4 % (ref 11.5–15.5)
WBC: 15.4 10*3/uL — AB (ref 4.0–10.5)

## 2014-07-18 LAB — TYPE AND SCREEN
ABO/RH(D): O POS
Antibody Screen: NEGATIVE

## 2014-07-18 MED ORDER — SIMETHICONE 80 MG PO CHEW: 80.0000 mg | CHEWABLE_TABLET | ORAL | Status: DC | PRN

## 2014-07-18 MED ORDER — DIBUCAINE 1 % RE OINT: 1.0000 "application " | TOPICAL_OINTMENT | RECTAL | Status: DC | PRN

## 2014-07-18 MED ORDER — PRENATAL MULTIVITAMIN CH
1.0000 | ORAL_TABLET | Freq: Every day | ORAL | Status: DC
  Administered 2014-07-19 – 2014-07-20 (×2): 1 via ORAL
  Filled 2014-07-18 (×2): qty 1

## 2014-07-18 MED ORDER — BENZOCAINE-MENTHOL 20-0.5 % EX AERO: 1.0000 "application " | INHALATION_SPRAY | CUTANEOUS | Status: DC | PRN

## 2014-07-18 MED ORDER — OXYTOCIN 40 UNITS IN LACTATED RINGERS INFUSION - SIMPLE MED: 62.5000 mL/h | INTRAVENOUS | Status: DC | PRN

## 2014-07-18 MED ORDER — OXYTOCIN BOLUS FROM INFUSION: 500.0000 mL | INTRAVENOUS | Status: DC

## 2014-07-18 MED ORDER — CITRIC ACID-SODIUM CITRATE 334-500 MG/5ML PO SOLN: 30.0000 mL | ORAL | Status: DC | PRN

## 2014-07-18 MED ORDER — FLEET ENEMA 7-19 GM/118ML RE ENEM: 1.0000 | ENEMA | RECTAL | Status: DC | PRN

## 2014-07-18 MED ORDER — LACTATED RINGERS IV SOLN
500.0000 mL | INTRAVENOUS | Status: DC | PRN
  Administered 2014-07-18: 500 mL via INTRAVENOUS

## 2014-07-18 MED ORDER — DIPHENHYDRAMINE HCL 50 MG/ML IJ SOLN: 12.5000 mg | INTRAMUSCULAR | Status: DC | PRN

## 2014-07-18 MED ORDER — ACETAMINOPHEN 325 MG PO TABS: 650.0000 mg | ORAL_TABLET | ORAL | Status: DC | PRN

## 2014-07-18 MED ORDER — LIDOCAINE HCL (PF) 1 % IJ SOLN
INTRAMUSCULAR | Status: DC | PRN
  Administered 2014-07-18 (×2): 8 mL

## 2014-07-18 MED ORDER — LACTATED RINGERS IV SOLN
INTRAVENOUS | Status: DC
  Administered 2014-07-18 (×3): via INTRAVENOUS

## 2014-07-18 MED ORDER — BUTORPHANOL TARTRATE 1 MG/ML IJ SOLN
1.0000 mg | INTRAMUSCULAR | Status: DC | PRN
  Administered 2014-07-18 (×2): 1 mg via INTRAVENOUS
  Filled 2014-07-18 (×2): qty 1

## 2014-07-18 MED ORDER — ONDANSETRON HCL 4 MG PO TABS: 4.0000 mg | ORAL_TABLET | ORAL | Status: DC | PRN

## 2014-07-18 MED ORDER — TETANUS-DIPHTH-ACELL PERTUSSIS 5-2.5-18.5 LF-MCG/0.5 IM SUSP: 0.5000 mL | Freq: Once | INTRAMUSCULAR | Status: DC

## 2014-07-18 MED ORDER — OXYTOCIN 40 UNITS IN LACTATED RINGERS INFUSION - SIMPLE MED
1.0000 m[IU]/min | INTRAVENOUS | Status: DC
  Administered 2014-07-18: 2 m[IU]/min via INTRAVENOUS

## 2014-07-18 MED ORDER — LIDOCAINE HCL (PF) 1 % IJ SOLN
30.0000 mL | INTRAMUSCULAR | Status: DC | PRN
  Filled 2014-07-18: qty 30

## 2014-07-18 MED ORDER — OXYTOCIN 40 UNITS IN LACTATED RINGERS INFUSION - SIMPLE MED
62.5000 mL/h | INTRAVENOUS | Status: DC
  Filled 2014-07-18: qty 1000

## 2014-07-18 MED ORDER — IBUPROFEN 600 MG PO TABS
600.0000 mg | ORAL_TABLET | Freq: Four times a day (QID) | ORAL | Status: DC
  Administered 2014-07-18 – 2014-07-20 (×7): 600 mg via ORAL
  Filled 2014-07-18 (×8): qty 1

## 2014-07-18 MED ORDER — LANOLIN HYDROUS EX OINT: TOPICAL_OINTMENT | CUTANEOUS | Status: DC | PRN

## 2014-07-18 MED ORDER — OXYCODONE-ACETAMINOPHEN 5-325 MG PO TABS
2.0000 | ORAL_TABLET | ORAL | Status: DC | PRN
  Filled 2014-07-18: qty 2

## 2014-07-18 MED ORDER — OXYCODONE-ACETAMINOPHEN 5-325 MG PO TABS
1.0000 | ORAL_TABLET | ORAL | Status: DC | PRN
  Administered 2014-07-19: 1 via ORAL
  Filled 2014-07-18 (×2): qty 1

## 2014-07-18 MED ORDER — FENTANYL 2.5 MCG/ML BUPIVACAINE 1/10 % EPIDURAL INFUSION (WH - ANES)
14.0000 mL/h | INTRAMUSCULAR | Status: DC | PRN
  Administered 2014-07-18 (×2): 14 mL/h via EPIDURAL
  Filled 2014-07-18: qty 125

## 2014-07-18 MED ORDER — ONDANSETRON HCL 4 MG/2ML IJ SOLN: 4.0000 mg | Freq: Four times a day (QID) | INTRAMUSCULAR | Status: DC | PRN

## 2014-07-18 MED ORDER — PHENYLEPHRINE 40 MCG/ML (10ML) SYRINGE FOR IV PUSH (FOR BLOOD PRESSURE SUPPORT)
80.0000 ug | PREFILLED_SYRINGE | INTRAVENOUS | Status: DC | PRN
  Filled 2014-07-18: qty 20
  Filled 2014-07-18: qty 2

## 2014-07-18 MED ORDER — ZOLPIDEM TARTRATE 5 MG PO TABS: 5.0000 mg | ORAL_TABLET | Freq: Every evening | ORAL | Status: DC | PRN

## 2014-07-18 MED ORDER — OXYCODONE-ACETAMINOPHEN 5-325 MG PO TABS
1.0000 | ORAL_TABLET | ORAL | Status: DC | PRN
  Administered 2014-07-19 (×2): 1 via ORAL
  Filled 2014-07-18 (×2): qty 1

## 2014-07-18 MED ORDER — ONDANSETRON HCL 4 MG/2ML IJ SOLN
4.0000 mg | INTRAMUSCULAR | Status: DC | PRN
Start: 2014-07-18 — End: 2014-07-20

## 2014-07-18 MED ORDER — TERBUTALINE SULFATE 1 MG/ML IJ SOLN
0.2500 mg | Freq: Once | INTRAMUSCULAR | Status: AC | PRN
  Filled 2014-07-18: qty 1

## 2014-07-18 MED ORDER — OXYCODONE-ACETAMINOPHEN 5-325 MG PO TABS
2.0000 | ORAL_TABLET | ORAL | Status: DC | PRN
  Administered 2014-07-20 (×2): 2 via ORAL
  Filled 2014-07-18: qty 2

## 2014-07-18 MED ORDER — SENNOSIDES-DOCUSATE SODIUM 8.6-50 MG PO TABS
2.0000 | ORAL_TABLET | ORAL | Status: DC
  Administered 2014-07-18 – 2014-07-19 (×2): 2 via ORAL
  Filled 2014-07-18 (×2): qty 2

## 2014-07-18 MED ORDER — DIPHENHYDRAMINE HCL 25 MG PO CAPS: 25.0000 mg | ORAL_CAPSULE | Freq: Four times a day (QID) | ORAL | Status: DC | PRN

## 2014-07-18 MED ORDER — WITCH HAZEL-GLYCERIN EX PADS: 1.0000 "application " | MEDICATED_PAD | CUTANEOUS | Status: DC | PRN

## 2014-07-18 MED ORDER — EPHEDRINE 5 MG/ML INJ
10.0000 mg | INTRAVENOUS | Status: DC | PRN
  Filled 2014-07-18: qty 2

## 2014-07-18 NOTE — MAU Note (Signed)
C/o increased uc pain around 0400 this AM; Seen in MAU yesterday for labor eval also;

## 2014-07-18 NOTE — Anesthesia Procedure Notes (Signed)
Epidural Patient location during procedure: OB Start time: 07/18/2014 2:50 PM End time: 07/18/2014 2:54 PM  Staffing Anesthesiologist: Lyn Hollingshead Performed by: anesthesiologist   Preanesthetic Checklist Completed: patient identified, surgical consent, pre-op evaluation, timeout performed, IV checked, risks and benefits discussed and monitors and equipment checked  Epidural Patient position: sitting Prep: site prepped and draped and DuraPrep Patient monitoring: continuous pulse ox and blood pressure Approach: midline Location: L3-L4 Injection technique: LOR air  Needle:  Needle type: Tuohy  Needle gauge: 17 G Needle length: 9 cm and 9 Needle insertion depth: 6 cm Catheter type: closed end flexible Catheter size: 19 Gauge Catheter at skin depth: 11 cm Test dose: negative and Other  Assessment Sensory level: T9 Events: blood not aspirated, injection not painful, no injection resistance, negative IV test and no paresthesia  Additional Notes Reason for block:procedure for pain

## 2014-07-18 NOTE — Anesthesia Preprocedure Evaluation (Signed)

## 2014-07-18 NOTE — H&P (Signed)
Penny Leonard is a 19 y.o. female presenting for regular painful ctx. No vaginal bleeding, notes good fetal movement  Maternal Medical History:  Reason for admission: Contractions.  Nausea.  Contractions: Onset was 3-5 hours ago.   Frequency: regular.   Perceived severity is moderate.    Fetal activity: Perceived fetal activity is normal.   Last perceived fetal movement was within the past 12 hours.    Prenatal complications: no prenatal complications Prenatal Complications - Diabetes: none.    OB History    Gravida Para Term Preterm AB TAB SAB Ectopic Multiple Living   1              Past Medical History  Diagnosis Date  . Medical history non-contributory    Past Surgical History  Procedure Laterality Date  . Wrist surgery     Family History: family history includes Diabetes in her father. Social History:  reports that she has never smoked. She has never used smokeless tobacco. She reports that she uses illicit drugs (Marijuana). She reports that she does not drink alcohol.   Prenatal Transfer Tool  Maternal Diabetes: No Genetic Screening: Normal Maternal Ultrasounds/Referrals: Normal Fetal Ultrasounds or other Referrals:  Other: Normal anatomy Maternal Substance Abuse:  No Significant Maternal Medications:  None Significant Maternal Lab Results:  None Other Comments:  None  Review of Systems  Constitutional: Negative for fever and chills.  Eyes: Negative for blurred vision and double vision.  Respiratory: Negative for cough.   Cardiovascular: Negative for chest pain and palpitations.  Gastrointestinal: Negative for heartburn and nausea.  Genitourinary: Negative for dysuria and urgency.  Musculoskeletal: Negative for myalgias.  Skin: Negative for itching and rash.  Neurological: Negative for dizziness, tingling and headaches.  Endo/Heme/Allergies: Does not bruise/bleed easily.  Psychiatric/Behavioral: Negative for depression.  All other systems reviewed and  are negative.   Dilation: 4 Effacement (%): 90 Station: -2 Exam by:: Devoria Glassing RN Blood pressure 153/76, pulse 90, temperature 98 F (36.7 C), temperature source Oral, resp. rate 20, last menstrual period 10/23/2013. Maternal Exam:  Uterine Assessment: Contraction strength is moderate.  Contraction duration is 60 seconds. Contraction frequency is regular.   Abdomen: Patient reports no abdominal tenderness. Fundal height is 40  cm.   Estimated fetal weight is 3100 grams.   Fetal presentation: vertex  Introitus: Normal vulva. Normal vagina.  Ferning test: not done.  Nitrazine test: not done. Amniotic fluid character: not assessed.  Pelvis: adequate for delivery.   Cervix: Cervix evaluated by digital exam.   4/90/-2  Fetal Exam Fetal Monitor Review: Baseline rate: 130.  Variability: moderate (6-25 bpm).   Pattern: accelerations present and no decelerations.    Fetal State Assessment: Category I - tracings are normal.     Physical Exam  Vitals reviewed. Constitutional: She is oriented to person, place, and time. She appears well-developed and well-nourished.  HENT:  Head: Normocephalic and atraumatic.  Eyes: Pupils are equal, round, and reactive to light.  Respiratory: Effort normal and breath sounds normal.  GI: Soft.  Genitourinary: Vagina normal.  Musculoskeletal: Normal range of motion.  Neurological: She is alert and oriented to person, place, and time. She has normal reflexes.  Skin: Skin is warm.    Prenatal labs: ABO, Rh: O/Positive/-- (12/29 0000) Antibody: Negative (12/29 0000) Rubella: Immune (12/29 0000) RPR: Nonreactive (12/29 0000)  HBsAg: Negative (12/29 0000)  HIV: NONREACTIVE (10/16 2345)  GBS: Negative (05/24 0000)   Assessment/Plan: 19 yo G1P0 at 40 weeks in active labor  Admit to L&D Epidural on demand Continuous monitoring    Penny Leonard 07/18/2014, 8:06 AM

## 2014-07-18 NOTE — Progress Notes (Signed)
Provider reviewed FHR tracing and ok with removal of monitors for epidural placement

## 2014-07-18 NOTE — MAU Note (Addendum)
PT SAYS SHE WAS HERE YESTERDAY   VE  1-2  CM.  SAYS  STARTED  HURTING   MORE AT 0400  .  DENIES   HSV.  SAYS HAD MRSA IN 2012- HAD  SORE ON LEFT LOWER  BUTTOCKS .  GBS- NEG

## 2014-07-19 LAB — CBC
HEMATOCRIT: 31.8 % — AB (ref 36.0–46.0)
Hemoglobin: 10 g/dL — ABNORMAL LOW (ref 12.0–15.0)
MCH: 26.5 pg (ref 26.0–34.0)
MCHC: 31.4 g/dL (ref 30.0–36.0)
MCV: 84.4 fL (ref 78.0–100.0)
Platelets: 215 10*3/uL (ref 150–400)
RBC: 3.77 MIL/uL — AB (ref 3.87–5.11)
RDW: 15.7 % — ABNORMAL HIGH (ref 11.5–15.5)
WBC: 17.5 10*3/uL — AB (ref 4.0–10.5)

## 2014-07-19 LAB — RPR
RPR Ser Ql: NONREACTIVE
RPR: NONREACTIVE

## 2014-07-19 NOTE — Lactation Note (Addendum)
This note was copied from the chart of Penny Greenland. Lactation Consultation Note Mom wanting to supplement d/t mom doesn't think she has enough for baby to be satisfied. Mom has everted nipples, hand expression demonstrated w/colostrum noted. Mom still states she is worried because baby acts hungry. I asked mom what her plans were when she went home. Mom stated she was going to mainly breast feed but would supplement so others could feed the baby. Mom encouraged to feed baby 8-12 times/24 hours and with feeding cues. Mom encouraged to waken baby for feeds. Educated about newborn behavior. Referred to Baby and Me Book in Breastfeeding section Pg. 22-23 for position options and Proper latch demonstration.Mom encouraged to do skin-to-skin.[place] brochure given w/resources, support groups and [place] services. Discussed supply and demand and how supplementing w.formula can reduce milk supply.  Patient Name: Penny Leonard Today's Date: 07/19/2014     Maternal Data    Feeding Feeding Type: Breast Fed Nipple Type: Slow - flow Length of feed: 10 min  LATCH Score/Interventions                      Lactation Tools Discussed/Used     Consult Status      Theodoro Kalata 07/19/2014, 4:33 PM

## 2014-07-19 NOTE — Lactation Note (Signed)
This note was copied from the chart of Penny Greenland. Lactation Consultation Note New mom states that the baby has been Bf for 2 hours wrapped in swaddled blanket. Nipples are reddened. Comfort gels given. Hand expression taught w/noted colostrum. Encouraged to rub to nipples for soreness. Asked tocall for assist in feeding. Patient Name: Penny Leonard Want Today's Date: 07/19/2014 Reason for consult: Initial assessment   Maternal Data Has patient been taught Hand Expression?: Yes Does the patient have breastfeeding experience prior to this delivery?: No  Feeding Feeding Type: Breast Fed Length of feed: 120 min  LATCH Score/Interventions Latch: Too sleepy or reluctant, no latch achieved, no sucking elicited. Intervention(s): Skin to skin;Teach feeding cues;Waking techniques  Audible Swallowing: None Intervention(s): Skin to skin;Hand expression;Alternate breast massage  Type of Nipple: Everted at rest and after stimulation  Comfort (Breast/Nipple): Filling, red/small blisters or bruises, mild/mod discomfort  Problem noted: Mild/Moderate discomfort Interventions (Mild/moderate discomfort): Comfort gels;Hand massage;Hand expression  Hold (Positioning): Assistance needed to correctly position infant at breast and maintain latch. Intervention(s): Support Pillows;Position options  LATCH Score: 5  Lactation Tools Discussed/Used     Consult Status Consult Status: Follow-up Date: 07/19/14 (will call for assistance in latching) Follow-up type: In-patient    Theodoro Kalata 07/19/2014, 8:53 AM

## 2014-07-19 NOTE — Anesthesia Postprocedure Evaluation (Signed)
  Anesthesia Post-op Note  Patient: Penny Leonard  Procedure(s) Performed: * No procedures listed *  Patient Location: Mother/Baby  Anesthesia Type:Epidural  Level of Consciousness: awake and alert   Airway and Oxygen Therapy: Patient Spontanous Breathing  Post-op Pain: mild  Post-op Assessment: Post-op Vital signs reviewed, Patient's Cardiovascular Status Stable, Respiratory Function Stable, No signs of Nausea or vomiting, Pain level controlled, No headache, No residual numbness and No residual motor weakness  Post-op Vital Signs: Reviewed  Last Vitals:  Filed Vitals:   07/19/14 0100  BP: 136/78  Pulse: 98  Temp: 36.6 C  Resp: 18    Complications: No apparent anesthesia complications

## 2014-07-20 MED ORDER — IBUPROFEN 600 MG PO TABS: 600.0000 mg | ORAL_TABLET | Freq: Four times a day (QID) | ORAL | Status: DC | PRN

## 2014-07-20 MED ORDER — OXYCODONE-ACETAMINOPHEN 5-325 MG PO TABS: 1.0000 | ORAL_TABLET | ORAL | Status: DC | PRN

## 2014-07-20 NOTE — Discharge Summary (Signed)
Obstetric Discharge Summary Reason for Admission: onset of labor Prenatal Procedures: none, NST and ultrasound Intrapartum Procedures: spontaneous vaginal delivery Postpartum Procedures: none Complications-Operative and Postpartum: none HEMOGLOBIN  Date Value Ref Range Status  07/19/2014 10.0* 12.0 - 15.0 g/dL Final   HCT  Date Value Ref Range Status  07/19/2014 31.8* 36.0 - 46.0 % Final    Physical Exam:  General: alert, cooperative and no distress Lochia: appropriate Uterine Fundus: firm DVT Evaluation: No evidence of DVT seen on physical exam. Negative Homan's sign. No cords or calf tenderness.  Discharge Diagnoses: Term Pregnancy-delivered  Discharge Information: Date: 07/20/2014 Activity: pelvic rest Diet: routine Medications: PNV and Ibuprofen Condition: stable Instructions: refer to practice specific booklet Discharge to: home   Newborn Data: Live born female  Birth Weight: 7 lb 9.4 oz (3442 g) APGAR: 9,   Home with mother.  Amar Keenum, [place] 07/20/2014, 8:49 AM

## 2014-07-21 ENCOUNTER — Ambulatory Visit: Payer: Self-pay

## 2014-07-21 NOTE — Lactation Note (Signed)
This note was copied from the chart of Penny Greenland. Lactation Consultation Note Mom c/o nipple pain. Mom wearing comfort gels. States nipples tender. Good everted nipples. Discussed obtaining deep latch verses shallow latch, positioning, supply and demand. Baby d/t feed. Gave formula last feeding. Offer to assist in obtaining deep latch, mom stated that she needed to let her nipples rest and was going to bottle feed this time. Stress the importance of stimulating breast. encouraged to rub colostrum to nipples. Hand expression done to demonstrate that, mom to tender to tolerate breast massage. Baby on double photo therapy, noted spit up of coffee ground emesis on bed. Reported to RN. Patient Name: Penny Leonard [id number] Date: 07/21/2014 Reason for consult: Follow-up assessment;Breast/nipple pain   Maternal Data    Feeding    LATCH Score/Interventions    Intervention(s): Hand expression  Type of Nipple: Everted at rest and after stimulation  Comfort (Breast/Nipple): Filling, red/small blisters or bruises, mild/mod discomfort  Problem noted: Mild/Moderate discomfort Interventions (Mild/moderate discomfort): Comfort gels        Lactation Tools Discussed/Used Tools: Comfort gels   Consult Status Consult Status: Follow-up Date: 07/21/14 (in pm) Follow-up type: In-patient    Theodoro Kalata 07/21/2014, 6:49 AM

## 2014-07-21 NOTE — Lactation Note (Addendum)
This note was copied from the chart of Penny Greenland. Lactation Consultation Note  Patient Name: Penny Leonard CHENI'D Date: 07/21/2014 Reason for consult: Follow-up assessment;Difficult latch;Breast/nipple pain (Baby patient for hyperbilirubinemia.) Baby patient, 63 hours of life. Assisted mom to latch baby in football position to left breast. Mom states her nipples are sore and she is wearing comfort gels. Mom able to hand express with colostrum visible from both breasts. Mom return-demonstrated use of hand pump. Baby latched deeply, suckling rhythmically with intermittent swallows noted. Mom states that she has increased comfort with this latch. Enc mom to maintain a deep latch and re-latch as needed. Referred mom to Baby and Me booklet for number of diapers to expect by day of life and EBM storage guidelines. Mom aware of OP/BFSG and [place] [facility] phone line assistance after D/C. Enc mom to call for assistance as needed. Mom states that she understands supply and demand and putting baby to breast first, but does have formula as needed.   Maternal Data    Feeding Feeding Type: Breast Fed Nipple Type: Slow - flow Length of feed:  (Assessed first 10 mionutes of BF.)  LATCH Score/Interventions Latch: Grasps breast easily, tongue down, lips flanged, rhythmical sucking. Intervention(s): Skin to skin Intervention(s): Adjust position;Assist with latch;Breast compression  Audible Swallowing: Spontaneous and intermittent Intervention(s): Skin to skin;Hand expression  Type of Nipple: Everted at rest and after stimulation  Comfort (Breast/Nipple): Filling, red/small blisters or bruises, mild/mod discomfort  Problem noted: Mild/Moderate discomfort Interventions (Mild/moderate discomfort): Pre-pump if needed;Comfort gels  Hold (Positioning): Assistance needed to correctly position infant at breast and maintain latch. Intervention(s): Breastfeeding basics reviewed;Support Pillows;Position  options;Skin to skin  LATCH Score: 8  Lactation Tools Discussed/Used Tools: Comfort gels   Consult Status Consult Status: PRN Date: 07/21/14 (in pm) Follow-up type: In-patient    Inocente Salles 07/21/2014, 10:25 AM

## 2014-07-21 NOTE — Progress Notes (Signed)
Post discharge chart review completed.  

## 2015-12-10 ENCOUNTER — Encounter (HOSPITAL_COMMUNITY): Payer: Self-pay | Admitting: Emergency Medicine

## 2015-12-10 ENCOUNTER — Emergency Department (HOSPITAL_COMMUNITY)
Admission: EM | Admit: 2015-12-10 | Discharge: 2015-12-10 | Disposition: A | Discharge status: Home / Self Care | Payer: Medicaid Other | Attending: Emergency Medicine | Admitting: Emergency Medicine

## 2015-12-10 DIAGNOSIS — M545 Low back pain, unspecified: Secondary | ICD-10-CM

## 2015-12-10 DIAGNOSIS — Y939 Activity, unspecified: Secondary | ICD-10-CM | POA: Insufficient documentation

## 2015-12-10 DIAGNOSIS — W0110XA Fall on same level from slipping, tripping and stumbling with subsequent striking against unspecified object, initial encounter: Secondary | ICD-10-CM | POA: Insufficient documentation

## 2015-12-10 DIAGNOSIS — Y999 Unspecified external cause status: Secondary | ICD-10-CM | POA: Insufficient documentation

## 2015-12-10 DIAGNOSIS — Y92000 Kitchen of unspecified non-institutional (private) residence as  the place of occurrence of the external cause: Secondary | ICD-10-CM | POA: Insufficient documentation

## 2015-12-10 DIAGNOSIS — S30810A Abrasion of lower back and pelvis, initial encounter: Secondary | ICD-10-CM | POA: Insufficient documentation

## 2015-12-10 DIAGNOSIS — T148XXA Other injury of unspecified body region, initial encounter: Secondary | ICD-10-CM

## 2015-12-10 MED ORDER — METHOCARBAMOL 500 MG PO TABS: 500.0000 mg | ORAL_TABLET | Freq: Two times a day (BID) | ORAL | 0 refills | Status: DC

## 2015-12-10 MED ORDER — NAPROXEN 375 MG PO TABS: 375.0000 mg | ORAL_TABLET | Freq: Two times a day (BID) | ORAL | 0 refills | Status: DC

## 2015-12-10 NOTE — ED Notes (Signed)
Patient called x 1

## 2015-12-10 NOTE — ED Triage Notes (Signed)
Pt states she had a slip and fall in her kitchen and scraped her back.  Superficial scrape to rt side back.  No other injuries.

## 2015-12-10 NOTE — ED Provider Notes (Signed)
[facility] DEPT Provider Note   CSN: [id number] Arrival date & time: 12/10/15  1015     History   Chief Complaint Chief Complaint  Patient presents with  . Fall  . Back Pain    HPI Penny Leonard is a 20 y.o. female.  20 year old African-American female with no significant past medical history presents to the ED today with back pain. Patient states that she was in her kitchen yesterday and slipped on water. She fell against the counter and scraped her right lateral mid back and fell to the ground. Patient denies hitting her head. She denies LOC. Patient has been ambulatory since the accident. Patient complains of a superficial scrape to the right side of her back. She denies any other injuries. She denies any fever, chills, headache vision changes, neck pain, chest pain, shortness of breath, abdominal pain, emesis, nausea, urinary symptoms, change in bowel habits, saddle paresthesia, urinary retention, loss of bowel or bladder, numbness/tingling, dizziness, lightheadedness. Patient states "she just wanted to see what she could take for pain." She denies any rib pain. Tdap is up to date.      Past Medical History:  Diagnosis Date  . Medical history non-contributory     Patient Active Problem List   Diagnosis Date Noted  . Normal vaginal delivery 07/18/2014  . GASTROENTERITIS, VIRAL 06/26/2007    Past Surgical History:  Procedure Laterality Date  . WRIST SURGERY      OB History    Gravida Para Term Preterm AB Living   1 1 1     1    SAB TAB Ectopic Multiple Live Births         0 1       Home Medications    Prior to Admission medications   Medication Sig Start Date End Date Taking? Authorizing Provider  calcium carbonate (TUMS - DOSED IN MG ELEMENTAL CALCIUM) 500 MG chewable tablet Chew 2 tablets by mouth daily as needed for indigestion or heartburn.    Historical Provider, MD  ibuprofen (ADVIL,MOTRIN) 600 MG tablet Take 1 tablet (600 mg total) by mouth every  6 (six) hours as needed for moderate pain. 07/20/14   Sanjuana Kava, MD  oxyCODONE-acetaminophen (PERCOCET/ROXICET) 5-325 MG per tablet Take 1-2 tablets by mouth every 4 (four) hours as needed (for pain scale greater than or equal to 4 and less than 7). 07/20/14   Sanjuana Kava, MD  Prenatal Vit-Fe Fumarate-FA (PRENATAL MULTIVITAMIN) TABS tablet Take 1 tablet by mouth daily at 12 noon.    Historical Provider, MD    Family History Family History  Problem Relation Age of Onset  . Diabetes Father     Social History Social History  Substance Use Topics  . Smoking status: Never Smoker  . Smokeless tobacco: Never Used  . Alcohol use No     Comment: occassional when not pregnant      Allergies   Review of patient's allergies indicates no known allergies.   Review of Systems Review of Systems  Constitutional: Negative for chills and fever.  HENT: Negative for congestion and rhinorrhea.   Eyes: Negative.   Respiratory: Negative for cough, chest tightness and shortness of breath.   Cardiovascular: Negative for chest pain, palpitations and leg swelling.  Gastrointestinal: Negative for abdominal pain, diarrhea, nausea and vomiting.  Genitourinary: Negative for dysuria, flank pain, hematuria and urgency.  Musculoskeletal: Positive for back pain. Negative for gait problem.  Skin: Positive for wound.  Neurological: Negative for dizziness, syncope, weakness, light-headedness, numbness  and headaches.  All other systems reviewed and are negative.    Physical Exam Updated Vital Signs BP 126/86 (BP Location: Left Arm)   Pulse 86   Temp 98.6 F (37 C) (Oral)   Resp 16   SpO2 100%   Physical Exam Physical Exam  Constitutional: Pt appears well-developed and well-nourished. No distress.  HENT:  Head: Normocephalic and atraumatic.  Mouth/Throat: Oropharynx is clear and moist. No oropharyngeal exudate.  Eyes: Conjunctivae are normal.  Neck: Normal range of motion. Neck supple.  Full ROM without  pain  Cardiovascular: Normal rate, regular rhythm and intact distal pulses.   Pulmonary/Chest: Effort normal and breath sounds normal. No respiratory distress. Pt has no wheezes. No chest wall tenderness. No body tenderness. No signs of crepitus or step off. No flail chest. Abdominal: Soft. Pt exhibits no distension. There is no tenderness. No CVA tenderness. No ecchymosis, or signs of intra abdominal injury. Musculoskeletal:  Full range of motion of the T-spine and L-spine No tenderness to palpation of the spinous processes of the T-spine or L-spine Mild tenderness to palpation of the right paraspinous muscles of the L-spine with a superficial 5 cm abrasion that is healing and bleeding is controlled without signs of infection including edema or erythema.Marland Kitchen   Lymphadenopathy:    Pt has no cervical adenopathy.  Neurological: Pt is alert. Pt has normal reflexes.  Reflex Scores:      Bicep reflexes are 2+ on the right side and 2+ on the left side.      Brachioradialis reflexes are 2+ on the right side and 2+ on the left side.      Patellar reflexes are 2+ on the right side and 2+ on the left side.      Achilles reflexes are 2+ on the right side and 2+ on the left side. Speech is clear and goal oriented, follows commands Normal 5/5 strength in upper and lower extremities bilaterally including dorsiflexion and plantar flexion, strong and equal grip strength Sensation normal to light and sharp touch Moves extremities without ataxia, coordination intact Normal gait Normal balance No Clonus   Skin: Skin is warm and dry. No rash noted. Pt is not diaphoretic. No erythema.  Psychiatric: Pt has a normal mood and affect. Behavior is normal.  Nursing note and vitals reviewed.    ED Treatments / Results  Labs (all labs ordered are listed, but only abnormal results are displayed) Labs Reviewed - No data to display  EKG  EKG Interpretation None       Radiology No results  found.  Procedures Procedures (including critical care time)  Medications Ordered in ED Medications - No data to display   Initial Impression / Assessment and Plan / ED Course  I have reviewed the triage vital signs and the nursing notes.  Pertinent labs & imaging results that were available during my care of the patient were reviewed by me and considered in my medical decision making (see chart for details).  Clinical Course  Patient with back pain.  No neurological deficits and normal neuro exam.  Patient can walk without any pain.  No loss of bowel or bladder control.  No concern for cauda equina.  No fever, night sweats, weight loss, h/o cancer, IVDU.  Abrasion is superficial without any signs of infection. Patient refused any imaging. States if the pain continues she will follow up with PCP if imaging is needed. No other signs of intraabdominal or intrathoracic injury. She has no other complaints. RICE  protocol and pain medicine indicated and discussed with patient. Strict return precautions given. She will follow up with PCP. Patient is hemodynamically stable and discharged home in no acute distress stable vital signs.    Final Clinical Impressions(s) / ED Diagnoses   Final diagnoses:  Acute right-sided low back pain without sciatica  Abrasion    New Prescriptions New Prescriptions   METHOCARBAMOL (ROBAXIN) 500 MG TABLET    Take 1 tablet (500 mg total) by mouth 2 (two) times daily.   NAPROXEN (NAPROSYN) 375 MG TABLET    Take 1 tablet (375 mg total) by mouth 2 (two) times daily.     Doristine Devoid, PA-C 12/10/15 1235    Lacretia Leigh, MD 12/10/15 [phone]

## 2015-12-10 NOTE — Discharge Instructions (Signed)
Please take the naproxen as needed for pain. You may take the Robaxin for muscle spasm. Please use a heating pad at night. Please follow-up with the primary care doctor if symptoms do not improve. Return to the ED if you develop worsening symptoms including unable to urinate, is controlling her bowel or bladders, groin numbness, fever, blood in your urine, worsening pain, or for any reason.

## 2016-02-15 NOTE — L&D Delivery Note (Signed)
Patient is a 21 y.o. now G2P2 s/p NSVD at [redacted]w[redacted]d, who was admitted for IOL due to [place].  Delivery Note At 4:46 AM a viable female was delivered via  (Presentation:Cephalic ; ROA).  APGAR: , ; weight pending   Placenta status:intact  Cord:3 vessels  with the following complications: none .   Anesthesia:  epidural Episiotomy:  none Lacerations:  Right periurethral superficial hemostatic Suture Repair: none Est. Blood Loss (mL): 400   Mom to postpartum.  Baby to Couplet care / Skin to Skin.   Head delivered ROA. No nuchal cord present. Shoulder and body delivered in usual fashion. Infant with spontaneous cry, placed on mother's abdomen, dried and bulb suctioned. Cord clamped x 2 after 1-minute delay, and cut by family member. Cord blood drawn. Placenta delivered spontaneously with gentle cord traction. Fundus firm with massage and Pitocin. Perineum inspected and found to have Right periurethral superficial which was found to be hemostatic.  Lucilla Edin. Arnella Pralle,  MD Family Medicine Resident PGY-1 12/21/16, 5:16 AM

## 2016-02-25 ENCOUNTER — Emergency Department (HOSPITAL_COMMUNITY)
Admission: EM | Admit: 2016-02-25 | Discharge: 2016-02-25 | Disposition: A | Discharge status: Home / Self Care | Payer: Medicaid Other | Attending: Emergency Medicine | Admitting: Emergency Medicine

## 2016-02-25 ENCOUNTER — Encounter (HOSPITAL_COMMUNITY): Payer: Self-pay

## 2016-02-25 DIAGNOSIS — R51 Headache: Secondary | ICD-10-CM | POA: Diagnosis not present

## 2016-02-25 DIAGNOSIS — R519 Headache, unspecified: Secondary | ICD-10-CM

## 2016-02-25 DIAGNOSIS — R110 Nausea: Secondary | ICD-10-CM | POA: Insufficient documentation

## 2016-02-25 LAB — URINALYSIS, ROUTINE W REFLEX MICROSCOPIC
Bacteria, UA: NONE SEEN
Bilirubin Urine: NEGATIVE
GLUCOSE, UA: NEGATIVE mg/dL
Ketones, ur: NEGATIVE mg/dL
Leukocytes, UA: NEGATIVE
Nitrite: NEGATIVE
PH: 8 (ref 5.0–8.0)
Protein, ur: NEGATIVE mg/dL
Specific Gravity, Urine: 1.027 (ref 1.005–1.030)

## 2016-02-25 LAB — POC URINE PREG, ED: PREG TEST UR: NEGATIVE

## 2016-02-25 MED ORDER — DIPHENHYDRAMINE HCL 50 MG/ML IJ SOLN
25.0000 mg | Freq: Once | INTRAMUSCULAR | Status: AC
  Administered 2016-02-25: 25 mg via INTRAMUSCULAR
  Filled 2016-02-25: qty 1

## 2016-02-25 MED ORDER — ONDANSETRON 8 MG PO TBDP: 8.0000 mg | ORAL_TABLET | Freq: Three times a day (TID) | ORAL | 0 refills | Status: DC | PRN

## 2016-02-25 MED ORDER — METOCLOPRAMIDE HCL 5 MG/ML IJ SOLN: 10.0000 mg | Freq: Once | INTRAMUSCULAR | Status: DC

## 2016-02-25 MED ORDER — METOCLOPRAMIDE HCL 5 MG/ML IJ SOLN
10.0000 mg | Freq: Once | INTRAMUSCULAR | Status: AC
  Administered 2016-02-25: 10 mg via INTRAMUSCULAR
  Filled 2016-02-25: qty 2

## 2016-02-25 MED ORDER — KETOROLAC TROMETHAMINE 60 MG/2ML IM SOLN
30.0000 mg | Freq: Once | INTRAMUSCULAR | Status: AC
  Administered 2016-02-25: 30 mg via INTRAMUSCULAR
  Filled 2016-02-25: qty 2

## 2016-02-25 NOTE — ED Provider Notes (Signed)
[facility] DEPT Provider Note   CSN: [id number] Arrival date & time: 02/25/16  1813  By signing my name below, I, Penny Leonard, attest that this documentation has been prepared under the direction and in the presence of [name], PA-C. Electronically Signed: Neta Leonard, ED Scribe. 02/25/2016. 8:27 PM.    History   Chief Complaint Chief Complaint  Patient presents with  . Possible Pregnancy    The history is provided by the patient. No language interpreter was used.   HPI Comments:  Penny Leonard is a 21 y.o. female who presents to the Emergency Department, here requesting a pregnancy test. Pt complains of associated nausea, headaches. LNMP was 3 days ago. Pt has not taken a home pregnancy test. Pt is G1P1A0, is sexually active without contraception, and states that her symptoms are similar to her last pregnancy. No alleviating factors noted. Pt denies any vaginal bleeding, fever, urinary symptoms, abdominal pain. Nothing making her symptoms better or worse. No medications tried at home.   Past Medical History:  Diagnosis Date  . Medical history non-contributory     Patient Active Problem List   Diagnosis Date Noted  . Normal vaginal delivery 07/18/2014  . GASTROENTERITIS, VIRAL 06/26/2007    Past Surgical History:  Procedure Laterality Date  . WRIST SURGERY      OB History    Gravida Para Term Preterm AB Living   1 1 1     1    SAB TAB Ectopic Multiple Live Births         0 1       Home Medications    Prior to Admission medications   Medication Sig Start Date End Date Taking? Authorizing Provider  calcium carbonate (TUMS - DOSED IN MG ELEMENTAL CALCIUM) 500 MG chewable tablet Chew 2 tablets by mouth daily as needed for indigestion or heartburn.    Historical Provider, MD  ibuprofen (ADVIL,MOTRIN) 600 MG tablet Take 1 tablet (600 mg total) by mouth every 6 (six) hours as needed for moderate pain. 07/20/14   Sanjuana Kava, MD  methocarbamol  (ROBAXIN) 500 MG tablet Take 1 tablet (500 mg total) by mouth 2 (two) times daily. 12/10/15   Doristine Devoid, PA-C  naproxen (NAPROSYN) 375 MG tablet Take 1 tablet (375 mg total) by mouth 2 (two) times daily. 12/10/15   Doristine Devoid, PA-C  oxyCODONE-acetaminophen (PERCOCET/ROXICET) 5-325 MG per tablet Take 1-2 tablets by mouth every 4 (four) hours as needed (for pain scale greater than or equal to 4 and less than 7). 07/20/14   Sanjuana Kava, MD  Prenatal Vit-Fe Fumarate-FA (PRENATAL MULTIVITAMIN) TABS tablet Take 1 tablet by mouth daily at 12 noon.    Historical Provider, MD    Family History Family History  Problem Relation Age of Onset  . Diabetes Father     Social History Social History  Substance Use Topics  . Smoking status: Never Smoker  . Smokeless tobacco: Never Used  . Alcohol use No     Comment: occassional when not pregnant      Allergies   Patient has no known allergies.   Review of Systems Review of Systems  Constitutional: Negative for fever.  Gastrointestinal: Positive for nausea. Negative for abdominal pain and vomiting.  Genitourinary: Negative for frequency, urgency, vaginal bleeding and vaginal discharge.  Neurological: Positive for headaches.  All other systems reviewed and are negative.    Physical Exam Updated Vital Signs BP 128/79 (BP Location: Right Arm)   Pulse  115   Temp 97.9 F (36.6 C) (Oral)   Resp 18   Ht 5\' 6"  (1.676 m)   Wt 185 lb (83.9 kg)   LMP 02/22/2016   SpO2 99%   BMI 29.86 kg/m   Physical Exam  Constitutional: She appears well-developed and well-nourished. No distress.  HENT:  Head: Normocephalic and atraumatic.  Eyes: Conjunctivae are normal.  Neck: Normal range of motion. Neck supple.  Cardiovascular: Normal rate, regular rhythm and normal heart sounds.   Pulmonary/Chest: Effort normal and breath sounds normal. No respiratory distress. She has no wheezes. She has no rales.  Abdominal: Soft. Bowel sounds are  normal. She exhibits no distension. There is no tenderness. There is no rebound.  Musculoskeletal: She exhibits no edema.  Neurological: She is alert.  Skin: Skin is warm and dry.  Psychiatric: She has a normal mood and affect. Her behavior is normal.  Nursing note and vitals reviewed.    ED Treatments / Results  DIAGNOSTIC STUDIES:  Oxygen Saturation is 99% on RA, normal by my interpretation.    COORDINATION OF CARE:  8:27 PM Discussed treatment plan with pt at bedside and pt agreed to plan.   Labs (all labs ordered are listed, but only abnormal results are displayed) Labs Reviewed  POC URINE PREG, ED    EKG  EKG Interpretation None       Radiology No results found.  Procedures Procedures (including critical care time)  Medications Ordered in ED Medications - No data to display   Initial Impression / Assessment and Plan / ED Course  I have reviewed the triage vital signs and the nursing notes.  Pertinent labs & imaging results that were available during my care of the patient were reviewed by me and considered in my medical decision making (see chart for details).  Clinical Course    Patient emergency department with nausea and headache. States that she is worried she may be pregnant. Last menstrual period was 3 days ago. Denies any abdominal pain, vaginal discharge, vaginal bleeding, urinary symptoms. Her urine pregnancy test here is negative.  Will treat headache with Toradol, Reglan, Benadryl, will reassess. Vital signs are normal. Patient is nontoxic-appearing. No meningismus. No fever. No other complaints at this time.  9:17 PM Pt feeling better. UA with no signs of dehydration or infection. Abdomen non tender. Question early onset viral syndrome.  Plan to dc home. Repeat preg test in 1-2 weeks if still having symptoms. Increased PO intake, rest, zofran for any nausea. Return precautions discussed.   Vitals:   02/25/16 1922  BP: 128/79  Pulse: 115  Resp:  18  Temp: 97.9 F (36.6 C)  TempSrc: Oral  SpO2: 99%  Weight: 83.9 kg  Height: 5\' 6"  (1.676 m)     Final Clinical Impressions(s) / ED Diagnoses   Final diagnoses:  Nausea  Nonintractable headache, unspecified chronicity pattern, unspecified headache type    New Prescriptions New Prescriptions   ONDANSETRON (ZOFRAN ODT) 8 MG DISINTEGRATING TABLET    Take 1 tablet (8 mg total) by mouth every 8 (eight) hours as needed for nausea or vomiting.   I personally performed the services described in this documentation, which was scribed in my presence. The recorded information has been reviewed and is accurate.     Jeannett Senior, PA-C 02/25/16 2123    Gwenyth Allegra Tegeler, MD 02/25/16 2245

## 2016-02-25 NOTE — ED Triage Notes (Signed)
Pt here requesting pregnancy test - denies any vaginal bleeding currently. States her LMP was three days ago. Pt reports she is sexually active without use of contrception

## 2016-02-25 NOTE — Discharge Instructions (Signed)
Increased fluid intake. Make sure to get good rest. Zofran for nausea as needed. Repeat pregnancy test if continue to have symptoms in 1-2 weeks. Follow up with primary care doctor as needed, return if worsening symptoms.

## 2016-02-25 NOTE — ED Notes (Signed)
See EDP assessment 

## 2016-05-04 ENCOUNTER — Inpatient Hospital Stay (HOSPITAL_COMMUNITY)
Admission: AD | Admit: 2016-05-04 | Discharge: 2016-05-04 | Disposition: A | Discharge status: Home / Self Care | Payer: Medicaid Other | Source: Ambulatory Visit | Attending: Obstetrics & Gynecology | Admitting: Obstetrics & Gynecology

## 2016-05-04 ENCOUNTER — Encounter (HOSPITAL_COMMUNITY): Payer: Self-pay | Admitting: *Deleted

## 2016-05-04 ENCOUNTER — Inpatient Hospital Stay (HOSPITAL_COMMUNITY): Payer: Medicaid Other

## 2016-05-04 DIAGNOSIS — A084 Viral intestinal infection, unspecified: Secondary | ICD-10-CM | POA: Diagnosis present

## 2016-05-04 DIAGNOSIS — O99332 Smoking (tobacco) complicating pregnancy, second trimester: Secondary | ICD-10-CM | POA: Insufficient documentation

## 2016-05-04 DIAGNOSIS — O26891 Other specified pregnancy related conditions, first trimester: Secondary | ICD-10-CM | POA: Diagnosis present

## 2016-05-04 DIAGNOSIS — Z79899 Other long term (current) drug therapy: Secondary | ICD-10-CM | POA: Insufficient documentation

## 2016-05-04 DIAGNOSIS — Z9889 Other specified postprocedural states: Secondary | ICD-10-CM | POA: Diagnosis not present

## 2016-05-04 DIAGNOSIS — Z3A01 Less than 8 weeks gestation of pregnancy: Secondary | ICD-10-CM | POA: Insufficient documentation

## 2016-05-04 DIAGNOSIS — F17200 Nicotine dependence, unspecified, uncomplicated: Secondary | ICD-10-CM | POA: Insufficient documentation

## 2016-05-04 DIAGNOSIS — N839 Noninflammatory disorder of ovary, fallopian tube and broad ligament, unspecified: Secondary | ICD-10-CM | POA: Insufficient documentation

## 2016-05-04 DIAGNOSIS — R109 Unspecified abdominal pain: Secondary | ICD-10-CM

## 2016-05-04 DIAGNOSIS — D271 Benign neoplasm of left ovary: Secondary | ICD-10-CM

## 2016-05-04 DIAGNOSIS — Z833 Family history of diabetes mellitus: Secondary | ICD-10-CM | POA: Insufficient documentation

## 2016-05-04 LAB — URINALYSIS, ROUTINE W REFLEX MICROSCOPIC
BILIRUBIN URINE: NEGATIVE
Bacteria, UA: NONE SEEN
Glucose, UA: NEGATIVE mg/dL
Hgb urine dipstick: NEGATIVE
Ketones, ur: NEGATIVE mg/dL
Nitrite: NEGATIVE
Protein, ur: NEGATIVE mg/dL
Specific Gravity, Urine: 1.017 (ref 1.005–1.030)
pH: 7 (ref 5.0–8.0)

## 2016-05-04 LAB — WET PREP, GENITAL
Sperm: NONE SEEN
TRICH WET PREP: NONE SEEN
Yeast Wet Prep HPF POC: NONE SEEN

## 2016-05-04 LAB — BASIC METABOLIC PANEL
ANION GAP: 7 (ref 5–15)
BUN: 6 mg/dL (ref 6–20)
CO2: 24 mmol/L (ref 22–32)
CREATININE: 0.67 mg/dL (ref 0.44–1.00)
Calcium: 9.1 mg/dL (ref 8.9–10.3)
Chloride: 105 mmol/L (ref 101–111)
GFR calc Af Amer: >60 mL/min (ref >60)
Glucose, Bld: 92 mg/dL (ref 65–99)
POTASSIUM: 4.1 mmol/L (ref 3.5–5.1)
Sodium: 136 mmol/L (ref 135–145)

## 2016-05-04 LAB — CBC WITH DIFFERENTIAL/PLATELET
BASOS ABS: 0 10*3/uL (ref 0.0–0.1)
Basophils Relative: 0 %
EOS PCT: 2 %
Eosinophils Absolute: 0.1 10*3/uL (ref 0.0–0.7)
HCT: 39.7 % (ref 36.0–46.0)
Hemoglobin: 12.9 g/dL (ref 12.0–15.0)
Lymphocytes Relative: 29 %
Lymphs Abs: 2.2 10*3/uL (ref 0.7–4.0)
MCH: 28 pg (ref 26.0–34.0)
MCHC: 32.5 g/dL (ref 30.0–36.0)
MCV: 86.1 fL (ref 78.0–100.0)
Monocytes Absolute: 0.3 10*3/uL (ref 0.1–1.0)
Monocytes Relative: 4 %
Neutro Abs: 5.1 10*3/uL (ref 1.7–7.7)
Neutrophils Relative %: 65 %
PLATELETS: 256 10*3/uL (ref 150–400)
RBC: 4.61 MIL/uL (ref 3.87–5.11)
RDW: 13.8 % (ref 11.5–15.5)
WBC: 7.7 10*3/uL (ref 4.0–10.5)

## 2016-05-04 LAB — HCG, QUANTITATIVE, PREGNANCY: hCG, Beta Chain, Quant, S: 37594 m[IU]/mL — ABNORMAL HIGH (ref <5)

## 2016-05-04 LAB — POCT PREGNANCY, URINE: PREG TEST UR: POSITIVE — AB

## 2016-05-04 MED ORDER — PROMETHAZINE HCL 12.5 MG PO TABS: 12.5000 mg | ORAL_TABLET | Freq: Four times a day (QID) | ORAL | 0 refills | Status: DC | PRN

## 2016-05-04 NOTE — Discharge Instructions (Signed)
Abdominal Pain During Pregnancy °Belly (abdominal) pain is common during pregnancy. Most of the time, it is not a serious problem. Other times, it can be a sign that something is wrong with the pregnancy. Always tell your doctor if you have belly pain. °Follow these instructions at home: °Monitor your belly pain for any changes. The following actions may help you feel better: °· Do not have sex (intercourse) or put anything in your vagina until you feel better. °· Rest until your pain stops. °· Drink clear fluids if you feel sick to your stomach (nauseous). Do not eat solid food until you feel better. °· Only take medicine as told by your doctor. °· Keep all doctor visits as told. °Get help right away if: °· You are bleeding, leaking fluid, or pieces of tissue come out of your vagina. °· You have more pain or cramping. °· You keep throwing up (vomiting). °· You have pain when you pee (urinate) or have blood in your pee. °· You have a fever. °· You do not feel your baby moving as much. °· You feel very weak or feel like passing out. °· You have trouble breathing, with or without belly pain. °· You have a very bad headache and belly pain. °· You have fluid leaking from your vagina and belly pain. °· You keep having watery poop (diarrhea). °· Your belly pain does not go away after resting, or the pain gets worse. °This information is not intended to replace advice given to you by your health care provider. Make sure you discuss any questions you have with your health care provider. °Document Released: 01/19/2009 Document Revised: 09/09/2015 Document Reviewed: 08/30/2012 °Elsevier Interactive Patient Education © 2017 [employer] ° °

## 2016-05-04 NOTE — MAU Provider Note (Signed)
[facility]    Provider Note   CSN: [id number] Arrival date & time: 05/04/16  1010     History   Chief Complaint Chief Complaint  Patient presents with  . Abdominal Pain    HPI Penny Leonard is a 21 y.o. G1P1001 who presents to the MAU with abdominal pain. The pain started 2 weeks ago and she describes the pain as sharp that comes and goes. When the pain is there it is a 7/10. She also reports diarrhea. Patient has been with her current partner 3 years. She reports having GC 3 months ago but states that she and her partner were treated.   Abdominal Pain  This is a new problem. The current episode started 1 to 4 weeks ago. The onset quality is gradual. The problem occurs intermittently. The problem has been unchanged. The pain is located in the LLQ. The pain is at a severity of 7/10. The quality of the pain is sharp. The abdominal pain does not radiate. Associated symptoms include diarrhea, frequency, headaches, nausea and vomiting. Pertinent negatives include no anorexia, dysuria, fever or hematuria. Nothing aggravates the pain. The pain is relieved by nothing. She has tried nothing for the symptoms.    Past Medical History:  Diagnosis Date  . Medical history non-contributory     Patient Active Problem List   Diagnosis Date Noted  . Normal vaginal delivery 07/18/2014  . GASTROENTERITIS, VIRAL 06/26/2007    Past Surgical History:  Procedure Laterality Date  . WRIST SURGERY      OB History    Gravida Para Term Preterm AB Living   2 1 1     1    SAB TAB Ectopic Multiple Live Births         0 1       Home Medications    Prior to Admission medications   Medication Sig Start Date End Date Taking? Authorizing Provider  promethazine (PHENERGAN) 12.5 MG tablet Take 1 tablet (12.5 mg total) by mouth every 6 (six) hours as needed for nausea or vomiting. 05/04/16   Hope Bunnie Pion, NP    Family History Family History  Problem Relation Age of Onset  . Diabetes Father      Social History Social History  Substance Use Topics  . Smoking status: Current Some Day Smoker  . Smokeless tobacco: Never Used  . Alcohol use No     Comment: occassional when not pregnant      Allergies   Patient has no known allergies.   Review of Systems Review of Systems  Constitutional: Negative for appetite change and fever.  HENT: Negative for congestion, ear pain and sore throat.   Eyes: Negative for pain, discharge, itching and visual disturbance.  Respiratory: Negative for chest tightness, shortness of breath and wheezing.   Cardiovascular: Negative for chest pain and leg swelling.  Gastrointestinal: Positive for abdominal pain, diarrhea, nausea and vomiting. Negative for anorexia.  Genitourinary: Positive for frequency. Negative for dysuria, hematuria, vaginal bleeding and vaginal discharge.  Musculoskeletal: Negative for back pain and neck pain.  Skin: Negative for rash.  Neurological: Positive for headaches. Negative for syncope.  Psychiatric/Behavioral: Negative for confusion. The patient is not nervous/anxious.      Physical Exam Updated Vital Signs BP 136/77 (BP Location: Left Arm)   Pulse (!) 54   Temp 97.8 F (36.6 C) (Oral)   Resp 18   Ht 5\' 6"  (1.676 m)   Wt 204 lb (92.5 kg)   LMP  03/17/2016 (Approximate)   BMI 32.93 kg/m   Physical Exam  Constitutional: She is oriented to person, place, and time. She appears well-developed and well-nourished. No distress.  HENT:  Head: Normocephalic.  Eyes: EOM are normal.  Neck: Neck supple.  Pulmonary/Chest: Effort normal.  Abdominal: Soft. Bowel sounds are normal. There is tenderness.  Minimal tenderness with palpation LLQ.  Genitourinary:  Genitourinary Comments: External genitalia without lesions, mucous d/c vaginal vault. Cervix closed, no CMT, no adnexal tenderness, uterus slightly enlarged.   Musculoskeletal: Normal range of motion.  Neurological: She is alert and oriented to person, place,  and time. No cranial nerve deficit.  Skin: Skin is warm and dry.  Psychiatric: She has a normal mood and affect. Her behavior is normal.  Nursing note and vitals reviewed.    ED Treatments / Results  Labs (all labs ordered are listed, but only abnormal results are displayed) Labs Reviewed  WET PREP, GENITAL - Abnormal; Notable for the following:       Result Value   Clue Cells Wet Prep HPF POC PRESENT (*)    WBC, Wet Prep HPF POC FEW (*)    All other components within normal limits  URINALYSIS, ROUTINE W REFLEX MICROSCOPIC - Abnormal; Notable for the following:    APPearance HAZY (*)    Leukocytes, UA TRACE (*)    Squamous Epithelial / LPF 6-30 (*)    All other components within normal limits  HCG, QUANTITATIVE, PREGNANCY - Abnormal; Notable for the following:    hCG, Beta Chain, Quant, S 37,594 (*)    All other components within normal limits  POCT PREGNANCY, URINE - Abnormal; Notable for the following:    Preg Test, Ur POSITIVE (*)    All other components within normal limits  CBC WITH DIFFERENTIAL/PLATELET  BASIC METABOLIC PANEL  RPR  HIV ANTIBODY (ROUTINE TESTING)  GC/CHLAMYDIA PROBE AMP ([place]) NOT AT [facility]   Radiology US Ob Comp Less 14 Wks  Result Date: 05/04/2016 CLINICAL DATA:  21 year old pregnant female presents with abdominal pain. EDC by LMP: 12/22/2016, projecting to an expected gestational age of [redacted] weeks 6 days . EXAM: OBSTETRIC <14 WK [place] AND TRANSVAGINAL OB US TECHNIQUE: Both transabdominal and transvaginal ultrasound examinations were performed for complete evaluation of the gestation as well as the maternal uterus, adnexal regions, and pelvic cul-de-sac. Transvaginal technique was performed to assess early pregnancy. COMPARISON:  No prior scans from this gestation. FINDINGS: Intrauterine gestational sac: Single intrauterine gestational sac appears normal in size, shape and position. Yolk sac:  Visualized. Embryo:  Visualized. Embryonic Cardiac Activity:  Regular rate and rhythm. Embryonic Heart Rate: 119  bpm CRL:  9.1  mm   6 w   6 d                  [place] EDC: 12/22/2016 Subchorionic hemorrhage: None visualized. The hypoechoic region measured surrounding the gestational sac demonstrates normal blood flow on the cine sequence, compatible with normal perigestational vascular space. Maternal uterus/adnexae: Retroverted uterus. No uterine fibroids demonstrated. Right ovary measures 3.9 x 2.7 x 2.5 cm and contains a corpus luteum. Left ovary measures 4.7 x 2.3 x 3.1 cm and contains a hyperechoic solid 2.8 x 2.3 x 2.8 cm mass was posterior acoustic shadowing, which measured 3.1 x 2.0 x 2.5 cm on 11/30/2013, not appreciably changed in size. No new abnormal ovarian or adnexal masses. No abnormal free fluid in the pelvis. IMPRESSION: 1. Single living intrauterine gestation at 6 weeks 6 days by  crown-rump length, concordant with provided menstrual dating. No acute first-trimester gestational abnormality. Embryonic heart rate 119 bpm, low normal. 2. 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 Electronically Signed   By: Ilona Sorrel M.D.   On: 05/04/2016 12:18   US Ob Transvaginal  Result Date: 05/04/2016 CLINICAL DATA:  21 year old pregnant female presents with abdominal pain. EDC by LMP: 12/22/2016, projecting to an expected gestational age of [redacted] weeks 6 days . EXAM: OBSTETRIC <14 WK [place] AND TRANSVAGINAL OB US TECHNIQUE: Both transabdominal and transvaginal ultrasound examinations were performed for complete evaluation of the gestation as well as the maternal uterus, adnexal regions, and pelvic cul-de-sac. Transvaginal technique was performed to assess early pregnancy. COMPARISON:  No prior scans from this gestation. FINDINGS: Intrauterine gestational sac: Single intrauterine gestational sac appears normal in size, shape and position. Yolk sac:  Visualized. Embryo:  Visualized. Embryonic  Cardiac Activity: Regular rate and rhythm. Embryonic Heart Rate: 119  bpm CRL:  9.1  mm   6 w   6 d                  [place] EDC: 12/22/2016 Subchorionic hemorrhage: None visualized. The hypoechoic region measured surrounding the gestational sac demonstrates normal blood flow on the cine sequence, compatible with normal perigestational vascular space. Maternal uterus/adnexae: Retroverted uterus. No uterine fibroids demonstrated. Right ovary measures 3.9 x 2.7 x 2.5 cm and contains a corpus luteum. Left ovary measures 4.7 x 2.3 x 3.1 cm and contains a hyperechoic solid 2.8 x 2.3 x 2.8 cm mass was posterior acoustic shadowing, which measured 3.1 x 2.0 x 2.5 cm on 11/30/2013, not appreciably changed in size. No new abnormal ovarian or adnexal masses. No abnormal free fluid in the pelvis. IMPRESSION: 1. Single living intrauterine gestation at 6 weeks 6 days by crown-rump length, concordant with provided menstrual dating. No acute first-trimester gestational abnormality. Embryonic heart rate 119 bpm, low normal. 2. 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 Electronically Signed   By: Ilona Sorrel M.D.   On: 05/04/2016 12:18    Procedures Procedures (including critical care time)  Medications Ordered in ED Medications - No data to display   Initial Impression / Assessment and Plan / ED Course  I have reviewed the triage vital signs and the nursing notes.  Pertinent labs & imaging results that were available during my care of the patient were reviewed by me and considered in my medical decision making (see chart for details).   Final Clinical Impressions(s) / ED Diagnoses  21 y.o. female with LLQ abdominal pain that comes and goes and n/v/d in early pregnancy stable for d/c without acute abdomen. Discussed with DR. Roselie Awkward and will have patient discuss ultrasound findings with her OB for follow up. Will treat for nausea.  Return precautions given.  Final diagnoses:  Teratoma of ovary, left  Abdominal pain in pregnancy, first trimester    New Prescriptions Discharge Medication List as of 05/04/2016 12:43 PM    START taking these medications   Details  promethazine (PHENERGAN) 12.5 MG tablet Take 1 tablet (12.5 mg total) by mouth every 6 (six) hours as needed for nausea or vomiting., Starting Wed 05/04/2016, Normal

## 2016-05-04 NOTE — MAU Note (Signed)
Pt C/O hot flashes, nausea but no vomiting, last LMP was first of February.  C/O lower abd pain for the last 2 weeks.

## 2016-05-05 LAB — GC/CHLAMYDIA PROBE AMP (~~LOC~~) NOT AT ARMC
Chlamydia: NEGATIVE
Neisseria Gonorrhea: NEGATIVE

## 2016-05-05 LAB — RPR: RPR Ser Ql: NONREACTIVE

## 2016-05-05 LAB — HIV ANTIBODY (ROUTINE TESTING W REFLEX): HIV Screen 4th Generation wRfx: NONREACTIVE

## 2016-08-01 ENCOUNTER — Other Ambulatory Visit (HOSPITAL_COMMUNITY)
Admission: RE | Admit: 2016-08-01 | Discharge: 2016-08-01 | Disposition: A | Discharge status: Home / Self Care | Payer: Medicaid Other | Source: Ambulatory Visit | Attending: Certified Nurse Midwife | Admitting: Certified Nurse Midwife

## 2016-08-01 ENCOUNTER — Ambulatory Visit (INDEPENDENT_AMBULATORY_CARE_PROVIDER_SITE_OTHER): Payer: Medicaid Other | Admitting: Certified Nurse Midwife

## 2016-08-01 ENCOUNTER — Encounter: Payer: Self-pay | Admitting: Certified Nurse Midwife

## 2016-08-01 VITALS — BP 129/73 | HR 95 | Wt 208.0 lb

## 2016-08-01 DIAGNOSIS — Z3480 Encounter for supervision of other normal pregnancy, unspecified trimester: Secondary | ICD-10-CM | POA: Insufficient documentation

## 2016-08-01 DIAGNOSIS — O219 Vomiting of pregnancy, unspecified: Secondary | ICD-10-CM

## 2016-08-01 DIAGNOSIS — D271 Benign neoplasm of left ovary: Secondary | ICD-10-CM

## 2016-08-01 DIAGNOSIS — Z3481 Encounter for supervision of other normal pregnancy, first trimester: Secondary | ICD-10-CM

## 2016-08-01 MED ORDER — DOXYLAMINE-PYRIDOXINE 10-10 MG PO TBEC: DELAYED_RELEASE_TABLET | ORAL | 4 refills | Status: DC

## 2016-08-01 MED ORDER — PRENATE PIXIE 10-0.6-0.4-200 MG PO CAPS: 1.0000 | ORAL_CAPSULE | Freq: Every day | ORAL | 12 refills | Status: DC

## 2016-08-01 MED ORDER — ONDANSETRON HCL 8 MG PO TABS: 8.0000 mg | ORAL_TABLET | Freq: Three times a day (TID) | ORAL | 2 refills | Status: DC | PRN

## 2016-08-01 NOTE — Progress Notes (Signed)
Subjective:    Penny Leonard is being seen today for her first obstetrical visit.  This is a planned pregnancy. She is at [redacted]w[redacted]d gestation. Her obstetrical history is significant for none. Relationship with FOB: significant other, living together. Patient does intend to breast feed. Pregnancy history fully reviewed.  The information documented in the HPI was reviewed and verified.  Menstrual History: OB History    Gravida Para Term Preterm AB Living   2 1 1     1    SAB TAB Ectopic Multiple Live Births         0 1       Patient's last menstrual period was 03/17/2016 (approximate).    History reviewed. No pertinent past medical history.  Past Surgical History:  Procedure Laterality Date  . WRIST SURGERY       (Not in a hospital admission) No Known Allergies  Social History  Substance Use Topics  . Smoking status: Light Tobacco Smoker  . Smokeless tobacco: Never Used  . Alcohol use No     Comment: occassional when not pregnant     Family History  Problem Relation Age of Onset  . Diabetes Father      Review of Systems Constitutional: negative for weight loss Gastrointestinal: negative for vomiting Genitourinary:negative for genital lesions and vaginal discharge and dysuria Musculoskeletal:negative for back pain Behavioral/Psych: negative for abusive relationship, depression, illegal drug usage and tobacco use    Objective:    BP 129/73   Pulse 95   Wt 208 lb (94.3 kg)   LMP 03/17/2016 (Approximate)   BMI 33.57 kg/m  General Appearance:    Alert, cooperative, no distress, appears stated age  Head:    Normocephalic, without obvious abnormality, atraumatic  Eyes:    PERRL, conjunctiva/corneas clear, EOM's intact, fundi    benign, both eyes  Ears:    Normal TM's and external ear canals, both ears  Nose:   Nares normal, septum midline, mucosa normal, no drainage    or sinus tenderness  Throat:   Lips, mucosa, and tongue normal; teeth and gums normal  Neck:    Supple, symmetrical, trachea midline, no adenopathy;    thyroid:  no enlargement/tenderness/nodules; no carotid   bruit or JVD  Back:     Symmetric, no curvature, ROM normal, no CVA tenderness  Lungs:     Clear to auscultation bilaterally, respirations unlabored  Chest Wall:    No tenderness or deformity   Heart:    Regular rate and rhythm, S1 and S2 normal, no murmur, rub   or gallop  Breast Exam:    No tenderness, masses, or nipple abnormality  Abdomen:     Soft, non-tender, bowel sounds active all four quadrants,    no masses, no organomegaly  Genitalia:    Normal female without lesion, discharge or tenderness  Extremities:   Extremities normal, atraumatic, no cyanosis or edema  Pulses:   2+ and symmetric all extremities  Skin:   Skin color, texture, turgor normal, no rashes or lesions  Lymph nodes:   Cervical, supraclavicular, and axillary nodes normal  Neurologic:   CNII-XII intact, normal strength, sensation and reflexes    throughout         Cervix:   Long, thick, closed and posterior.  FHR:148 by doppler.                                FH: c/w dates: 20cm  Lab Review Urine pregnancy test Labs reviewed yes Radiologic studies reviewed yes  Assessment & Plan    Pregnancy at [redacted]w[redacted]d weeks    1. Ovarian teratoma, left    Stable since 2015.   2. Supervision of other normal pregnancy, antepartum     - Hemoglobinopathy evaluation - Varicella zoster antibody, IgG - Culture, OB Urine - MaterniT21 PLUS Core+SCA - Hemoglobin A1c - Obstetric Panel, Including HIV - Cystic Fibrosis Mutation 97 - TSH Pregnancy - Cytology - PAP - Cervicovaginal ancillary only - [place] MFM OB COMP + 14 WK; Future - Prenat-FeAsp-Meth-FA-DHA w/o A (PRENATE PIXIE) 10-0.6-0.4-200 MG CAPS; Take 1 tablet by mouth daily.  Dispense: 30 capsule; Refill: 12  3. Nausea and vomiting during pregnancy prior to [redacted] weeks gestation      - Doxylamine-Pyridoxine (DICLEGIS) 10-10 MG TBEC; Take 1 tablet with breakfast  and lunch.  Take 2 tablets at bedtime.  Dispense: 100 tablet; Refill: 4 - ondansetron (ZOFRAN) 8 MG tablet; Take 1 tablet (8 mg total) by mouth every 8 (eight) hours as needed for nausea or vomiting.  Dispense: 40 tablet; Refill: 2   Prenatal vitamins.  Counseling provided regarding continued use of seat belts, cessation of alcohol consumption, smoking or use of illicit drugs; infection precautions i.e., influenza/TDAP immunizations, toxoplasmosis,CMV, parvovirus, listeria and varicella; workplace safety, exercise during pregnancy; routine dental care, safe medications, sexual activity, hot tubs, saunas, pools, travel, caffeine use, fish and methlymercury, potential toxins, hair treatments, varicose veins Weight gain recommendations per IOM guidelines reviewed: underweight/BMI< 18.5--> gain 28 - 40 lbs; normal weight/BMI 18.5 - 24.9--> gain 25 - 35 lbs; overweight/BMI 25 - 29.9--> gain 15 - 25 lbs; obese/BMI >30->gain  11 - 20 lbs Problem list reviewed and updated. FIRST/CF mutation testing/NIPT/QUAD SCREEN/fragile X/Ashkenazi Jewish population testing/Spinal muscular atrophy discussed: ordered. Role of ultrasound in pregnancy discussed; fetal survey: ordered. Amniocentesis discussed: not indicated.  Meds ordered this encounter  Medications  . Prenatal Vit-Fe Fumarate-FA (MULTIVITAMIN-PRENATAL) 27-0.8 MG TABS tablet    Sig: Take 1 tablet by mouth daily at 12 noon.  . Prenat-FeAsp-Meth-FA-DHA w/o A (PRENATE PIXIE) 10-0.6-0.4-200 MG CAPS    Sig: Take 1 tablet by mouth daily.    Dispense:  30 capsule    Refill:  12    Please process coupon: Rx BIN: [id number], RxPCN: [id number], RxGRP: [id number], RxID: [id number]  SUF: 01   Orders Placed This Encounter  Procedures  . Culture, OB Urine  . [place] MFM OB COMP + 14 WK    Standing Status:   Future    Standing Expiration Date:   10/01/2017    Order Specific Question:   Reason for Exam (SYMPTOM  OR DIAGNOSIS REQUIRED)    Answer:   fetal anatomy scan    Order  Specific Question:   Preferred imaging location?    Answer:   [facility]Ultrasound  . Hemoglobinopathy evaluation  . Varicella zoster antibody, IgG  . MaterniT21 PLUS Core+SCA    Order Specific Question:   Is the patient insulin dependent?    Answer:   No    Order Specific Question:   Please enter gestational age. This should be expressed as weeks AND days, i.e. 16w 6d. Enter weeks here. Enter days in next question.    Answer:   17    Order Specific Question:   Please enter gestational age. This should be expressed as weeks AND days, i.e. 16w 6d. Enter days here. Enter weeks in previous question.    Answer:   4  Order Specific Question:   How was gestational age calculated?    Answer:   Ultrasound    Order Specific Question:   Please give the date of LMP OR Ultrasound OR Estimated date of delivery.    Answer:   12/22/2016    Order Specific Question:   Number of Fetuses (Type of Pregnancy):    Answer:   1    Order Specific Question:   Indications for performing the test? (please choose all that apply):    Answer:   Routine screening    Order Specific Question:   Other Indications? (Y=Yes, N=No)    Answer:   N    Order Specific Question:   If this is a repeat specimen, please indicate the reason:    Answer:   Not indicated    Order Specific Question:   Please specify the patient's race: (C=White/Caucasion, B=Black, I=Native American, A=Asian, H=Hispanic, O=Other, U=Unknown)    Answer:   B    Order Specific Question:   Donor Egg - indicate if the egg was obtained from in vitro fertilization.    Answer:   N    Order Specific Question:   Age of Egg Donor.    Answer:   78    Order Specific Question:   Prior Down Syndrome/ONTD screening during current pregnancy.    Answer:   N    Order Specific Question:   Prior First Trimester Testing    Answer:   N    Order Specific Question:   Prior Second Trimester Testing    Answer:   N    Order Specific Question:   Family History of Neural Tube Defects     Answer:   N    Order Specific Question:   Prior Pregnancy with Down Syndrome    Answer:   N    Order Specific Question:   Please give the patient's weight (in pounds)    Answer:   208  . Hemoglobin A1c  . Obstetric Panel, Including HIV  . Cystic Fibrosis Mutation 97  . TSH Pregnancy    Follow up in 4 weeks. 50% of 30 min visit spent on counseling and coordination of care.

## 2016-08-03 ENCOUNTER — Telehealth: Payer: Self-pay

## 2016-08-03 ENCOUNTER — Other Ambulatory Visit: Payer: Self-pay | Admitting: Certified Nurse Midwife

## 2016-08-03 DIAGNOSIS — Z3480 Encounter for supervision of other normal pregnancy, unspecified trimester: Secondary | ICD-10-CM

## 2016-08-03 DIAGNOSIS — Z2839 Other underimmunization status: Secondary | ICD-10-CM | POA: Insufficient documentation

## 2016-08-03 DIAGNOSIS — Z283 Underimmunization status: Principal | ICD-10-CM

## 2016-08-03 DIAGNOSIS — O09899 Supervision of other high risk pregnancies, unspecified trimester: Secondary | ICD-10-CM

## 2016-08-03 LAB — OBSTETRIC PANEL, INCLUDING HIV
Antibody Screen: NEGATIVE
BASOS ABS: 0 10*3/uL (ref 0.0–0.2)
Basos: 0 %
EOS (ABSOLUTE): 0.2 10*3/uL (ref 0.0–0.4)
Eos: 2 %
HEMOGLOBIN: 10.7 g/dL — AB (ref 11.1–15.9)
HIV Screen 4th Generation wRfx: NONREACTIVE
Hematocrit: 32.4 % — ABNORMAL LOW (ref 34.0–46.6)
Hepatitis B Surface Ag: NEGATIVE
IMMATURE GRANS (ABS): 0.1 10*3/uL (ref 0.0–0.1)
IMMATURE GRANULOCYTES: 1 %
LYMPHS ABS: 2.5 10*3/uL (ref 0.7–3.1)
Lymphs: 23 %
MCH: 28 pg (ref 26.6–33.0)
MCHC: 33 g/dL (ref 31.5–35.7)
MCV: 85 fL (ref 79–97)
MONOS ABS: 0.9 10*3/uL (ref 0.1–0.9)
Monocytes: 8 %
NEUTROS PCT: 66 %
Neutrophils Absolute: 7.4 10*3/uL — ABNORMAL HIGH (ref 1.4–7.0)
PLATELETS: 225 10*3/uL (ref 150–379)
RBC: 3.82 x10E6/uL (ref 3.77–5.28)
RDW: 14.8 % (ref 12.3–15.4)
RPR Ser Ql: NONREACTIVE
Rh Factor: POSITIVE
Rubella Antibodies, IGG: 1.7 index (ref >0.99)
WBC: 11 10*3/uL — AB (ref 3.4–10.8)

## 2016-08-03 LAB — TSH PREGNANCY: TSH PREGNANCY: 2.14 u[IU]/mL (ref 0.450–4.500)

## 2016-08-03 LAB — CERVICOVAGINAL ANCILLARY ONLY
BACTERIAL VAGINITIS: NEGATIVE
CHLAMYDIA, DNA PROBE: NEGATIVE
Candida vaginitis: NEGATIVE
NEISSERIA GONORRHEA: NEGATIVE
Trichomonas: NEGATIVE

## 2016-08-03 LAB — HEMOGLOBINOPATHY EVALUATION
HEMOGLOBIN A2 QUANTITATION: 2.6 % (ref 1.8–3.2)
HEMOGLOBIN F QUANTITATION: 1.1 % (ref 0.0–2.0)
HGB C: 0 %
HGB S: 0 %
HGB VARIANT: 0 %
Hgb A: 96.3 % — ABNORMAL LOW (ref 96.4–98.8)

## 2016-08-03 LAB — VARICELLA ZOSTER ANTIBODY, IGG

## 2016-08-03 LAB — HEMOGLOBIN A1C
Est. average glucose Bld gHb Est-mCnc: 82 mg/dL
HEMOGLOBIN A1C: 4.5 % — AB (ref 4.8–5.6)

## 2016-08-03 NOTE — Telephone Encounter (Signed)
S/w pt and advised of lab results and vaccine needed after pregnancy.

## 2016-08-04 LAB — CULTURE, OB URINE

## 2016-08-04 LAB — URINE CULTURE, OB REFLEX

## 2016-08-04 LAB — CYTOLOGY - PAP: Diagnosis: NEGATIVE

## 2016-08-05 ENCOUNTER — Other Ambulatory Visit: Payer: Self-pay | Admitting: Certified Nurse Midwife

## 2016-08-05 DIAGNOSIS — Z3480 Encounter for supervision of other normal pregnancy, unspecified trimester: Secondary | ICD-10-CM

## 2016-08-06 LAB — MATERNIT21 PLUS CORE+SCA
Chromosome 13: NEGATIVE
Chromosome 18: NEGATIVE
Chromosome 21: NEGATIVE
Y CHROMOSOME: DETECTED

## 2016-08-08 LAB — CYSTIC FIBROSIS MUTATION 97: Interpretation: NOT DETECTED

## 2016-08-09 ENCOUNTER — Other Ambulatory Visit: Payer: Self-pay | Admitting: Certified Nurse Midwife

## 2016-08-09 DIAGNOSIS — Z3480 Encounter for supervision of other normal pregnancy, unspecified trimester: Secondary | ICD-10-CM

## 2016-08-10 ENCOUNTER — Ambulatory Visit (HOSPITAL_COMMUNITY)
Admission: RE | Admit: 2016-08-10 | Discharge: 2016-08-10 | Disposition: A | Discharge status: Home / Self Care | Payer: Medicaid Other | Source: Ambulatory Visit | Attending: Certified Nurse Midwife | Admitting: Certified Nurse Midwife

## 2016-08-10 ENCOUNTER — Other Ambulatory Visit: Payer: Self-pay | Admitting: Certified Nurse Midwife

## 2016-08-10 DIAGNOSIS — O0932 Supervision of pregnancy with insufficient antenatal care, second trimester: Secondary | ICD-10-CM | POA: Diagnosis not present

## 2016-08-10 DIAGNOSIS — Z3689 Encounter for other specified antenatal screening: Secondary | ICD-10-CM

## 2016-08-10 DIAGNOSIS — O99212 Obesity complicating pregnancy, second trimester: Secondary | ICD-10-CM | POA: Insufficient documentation

## 2016-08-10 DIAGNOSIS — Z3A20 20 weeks gestation of pregnancy: Secondary | ICD-10-CM | POA: Insufficient documentation

## 2016-08-10 DIAGNOSIS — Z3480 Encounter for supervision of other normal pregnancy, unspecified trimester: Secondary | ICD-10-CM

## 2016-08-10 DIAGNOSIS — Z3482 Encounter for supervision of other normal pregnancy, second trimester: Secondary | ICD-10-CM | POA: Diagnosis present

## 2016-08-16 ENCOUNTER — Other Ambulatory Visit: Payer: Self-pay | Admitting: Certified Nurse Midwife

## 2016-08-16 DIAGNOSIS — Z3480 Encounter for supervision of other normal pregnancy, unspecified trimester: Secondary | ICD-10-CM

## 2016-08-31 ENCOUNTER — Ambulatory Visit (INDEPENDENT_AMBULATORY_CARE_PROVIDER_SITE_OTHER): Payer: Medicaid Other | Admitting: Certified Nurse Midwife

## 2016-08-31 ENCOUNTER — Encounter: Payer: Self-pay | Admitting: Certified Nurse Midwife

## 2016-08-31 VITALS — BP 121/78 | HR 92 | Wt 211.8 lb

## 2016-08-31 DIAGNOSIS — O09899 Supervision of other high risk pregnancies, unspecified trimester: Secondary | ICD-10-CM

## 2016-08-31 DIAGNOSIS — Z283 Underimmunization status: Secondary | ICD-10-CM

## 2016-08-31 DIAGNOSIS — Z3480 Encounter for supervision of other normal pregnancy, unspecified trimester: Secondary | ICD-10-CM

## 2016-08-31 DIAGNOSIS — O09892 Supervision of other high risk pregnancies, second trimester: Secondary | ICD-10-CM

## 2016-08-31 NOTE — Progress Notes (Signed)
   PRENATAL VISIT NOTE  Subjective:  Penny Leonard is a 21 y.o. G2P1001 at [redacted]w[redacted]d being seen today for ongoing prenatal care.  She is currently monitored for the following issues for this low-risk pregnancy and has Ovarian teratoma, left; Supervision of other normal pregnancy, antepartum; and Maternal varicella, non-immune on her problem list.  Patient reports no complaints.  Contractions: Not present. Vag. Bleeding: None.  Movement: Present. Denies leaking of fluid.   The following portions of the patient's history were reviewed and updated as appropriate: allergies, current medications, past family history, past medical history, past social history, past surgical history and problem list. Problem list updated.  Objective:   Vitals:   08/31/16 1316  BP: 121/78  Pulse: 92  Weight: 211 lb 12.8 oz (96.1 kg)    Fetal Status: Fetal Heart Rate (bpm): 145 Fundal Height: 24 cm Movement: Present     General:  Alert, oriented and cooperative. Patient is in no acute distress.  Skin: Skin is warm and dry. No rash noted.   Cardiovascular: Normal heart rate noted  Respiratory: Normal respiratory effort, no problems with respiration noted  Abdomen: Soft, gravid, appropriate for gestational age.  Pain/Pressure: Absent     Pelvic: Cervical exam deferred        Extremities: Normal range of motion.  Edema: None  Mental Status:  Normal mood and affect. Normal behavior. Normal judgment and thought content.   Assessment and Plan:  Pregnancy: G2P1001 at [redacted]w[redacted]d  1. Supervision of other normal pregnancy, antepartum     Doing well  2. Maternal varicella, non-immune     Varicella postpartum  Preterm labor symptoms and general obstetric precautions including but not limited to vaginal bleeding, contractions, leaking of fluid and fetal movement were reviewed in detail with the patient. Please refer to After Visit Summary for other counseling recommendations.  Return in about 4 weeks (around 09/28/2016)  for ROB, 2 hr OGTT.   Morene Crocker, CNM

## 2016-08-31 NOTE — Progress Notes (Signed)
Patient reports good fetal movement, denies pain. 

## 2016-09-28 ENCOUNTER — Ambulatory Visit (INDEPENDENT_AMBULATORY_CARE_PROVIDER_SITE_OTHER): Payer: Medicaid Other | Admitting: Certified Nurse Midwife

## 2016-09-28 ENCOUNTER — Other Ambulatory Visit: Payer: Medicaid Other

## 2016-09-28 VITALS — BP 116/76 | HR 87 | Wt 222.6 lb

## 2016-09-28 DIAGNOSIS — Z3480 Encounter for supervision of other normal pregnancy, unspecified trimester: Secondary | ICD-10-CM

## 2016-09-28 DIAGNOSIS — O9989 Other specified diseases and conditions complicating pregnancy, childbirth and the puerperium: Secondary | ICD-10-CM

## 2016-09-28 DIAGNOSIS — O09899 Supervision of other high risk pregnancies, unspecified trimester: Secondary | ICD-10-CM

## 2016-09-28 DIAGNOSIS — Z283 Underimmunization status: Secondary | ICD-10-CM

## 2016-09-28 DIAGNOSIS — O09892 Supervision of other high risk pregnancies, second trimester: Secondary | ICD-10-CM

## 2016-09-28 DIAGNOSIS — Z3482 Encounter for supervision of other normal pregnancy, second trimester: Secondary | ICD-10-CM

## 2016-09-28 DIAGNOSIS — M549 Dorsalgia, unspecified: Secondary | ICD-10-CM

## 2016-09-28 DIAGNOSIS — O99891 Other specified diseases and conditions complicating pregnancy: Secondary | ICD-10-CM

## 2016-09-28 DIAGNOSIS — Z2839 Other underimmunization status: Secondary | ICD-10-CM

## 2016-09-28 MED ORDER — COMFORT FIT MATERNITY SUPP LG MISC: 1.0000 [IU] | Freq: Every day | 0 refills | Status: DC

## 2016-09-28 NOTE — Progress Notes (Signed)
   PRENATAL VISIT NOTE  Subjective:  Penny Leonard is a 21 y.o. G2P1001 at [redacted]w[redacted]d being seen today for ongoing prenatal care.  She is currently monitored for the following issues for this low-risk pregnancy and has Supervision of other normal pregnancy, antepartum and Maternal varicella, non-immune on her problem list.  Patient reports backache, no bleeding, no contractions, no cramping, no leaking and sciatic type pain while at work.  Contractions: Irregular. Vag. Bleeding: None.  Movement: Present. Denies leaking of fluid.   The following portions of the patient's history were reviewed and updated as appropriate: allergies, current medications, past family history, past medical history, past social history, past surgical history and problem list. Problem list updated.  Objective:   Vitals:   09/28/16 0901  BP: 116/76  Pulse: 87  Weight: 222 lb 9.6 oz (101 kg)    Fetal Status: Fetal Heart Rate (bpm): 149 Fundal Height: 28 cm Movement: Present     General:  Alert, oriented and cooperative. Patient is in no acute distress.  Skin: Skin is warm and dry. No rash noted.   Cardiovascular: Normal heart rate noted  Respiratory: Normal respiratory effort, no problems with respiration noted  Abdomen: Soft, gravid, appropriate for gestational age.  Pain/Pressure: Absent     Pelvic: Cervical exam deferred        Extremities: Normal range of motion.  Edema: None  Mental Status:  Normal mood and affect. Normal behavior. Normal judgment and thought content.   Assessment and Plan:  Pregnancy: G2P1001 at [redacted]w[redacted]d  1. Supervision of other normal pregnancy, antepartum      - CBC - HIV antibody - RPR - Glucose Tolerance, 2 Hours w/1 Hour  2. Maternal varicella, non-immune    Varicella postpartum  3. Back pain affecting pregnancy in third trimester     Homeopathic remedies discussed.  - Elastic Bandages & Supports (COMFORT FIT MATERNITY SUPP LG) MISC; 1 Units by Does not apply route daily.   Dispense: 1 each; Refill: 0  Preterm labor symptoms and general obstetric precautions including but not limited to vaginal bleeding, contractions, leaking of fluid and fetal movement were reviewed in detail with the patient. Please refer to After Visit Summary for other counseling recommendations.  Return in about 2 weeks (around 10/12/2016) for [place].   Morene Crocker, CNM

## 2016-09-28 NOTE — Progress Notes (Signed)
Pt complains of having lower back pain.

## 2016-09-29 LAB — CBC
HEMOGLOBIN: 9.6 g/dL — AB (ref 11.1–15.9)
Hematocrit: 29.8 % — ABNORMAL LOW (ref 34.0–46.6)
MCH: 27.3 pg (ref 26.6–33.0)
MCHC: 32.2 g/dL (ref 31.5–35.7)
MCV: 85 fL (ref 79–97)
PLATELETS: 224 10*3/uL (ref 150–379)
RBC: 3.52 x10E6/uL — AB (ref 3.77–5.28)
RDW: 15.2 % (ref 12.3–15.4)
WBC: 11.6 10*3/uL — AB (ref 3.4–10.8)

## 2016-09-29 LAB — GLUCOSE TOLERANCE, 2 HOURS W/ 1HR
GLUCOSE, 1 HOUR: 151 mg/dL (ref 65–179)
GLUCOSE, FASTING: 74 mg/dL (ref 65–91)
Glucose, 2 hour: 97 mg/dL (ref 65–152)

## 2016-09-29 LAB — RPR: RPR Ser Ql: NONREACTIVE

## 2016-09-29 LAB — HIV ANTIBODY (ROUTINE TESTING W REFLEX): HIV SCREEN 4TH GENERATION: NONREACTIVE

## 2016-10-03 ENCOUNTER — Other Ambulatory Visit: Payer: Self-pay | Admitting: Certified Nurse Midwife

## 2016-10-03 DIAGNOSIS — O99013 Anemia complicating pregnancy, third trimester: Secondary | ICD-10-CM | POA: Insufficient documentation

## 2016-10-03 DIAGNOSIS — Z3480 Encounter for supervision of other normal pregnancy, unspecified trimester: Secondary | ICD-10-CM

## 2016-10-03 MED ORDER — CITRANATAL BLOOM 90-1 MG PO TABS: 1.0000 | ORAL_TABLET | Freq: Every day | ORAL | 12 refills | Status: AC

## 2016-10-04 NOTE — Progress Notes (Signed)
Patient notified of results and [id number]

## 2016-10-12 ENCOUNTER — Ambulatory Visit (INDEPENDENT_AMBULATORY_CARE_PROVIDER_SITE_OTHER): Payer: Medicaid Other | Admitting: Certified Nurse Midwife

## 2016-10-12 ENCOUNTER — Encounter: Payer: Self-pay | Admitting: Certified Nurse Midwife

## 2016-10-12 VITALS — BP 109/70 | HR 108 | Wt 224.0 lb

## 2016-10-12 DIAGNOSIS — O99013 Anemia complicating pregnancy, third trimester: Secondary | ICD-10-CM

## 2016-10-12 DIAGNOSIS — M5430 Sciatica, unspecified side: Secondary | ICD-10-CM

## 2016-10-12 DIAGNOSIS — Z3480 Encounter for supervision of other normal pregnancy, unspecified trimester: Secondary | ICD-10-CM

## 2016-10-12 DIAGNOSIS — O09899 Supervision of other high risk pregnancies, unspecified trimester: Secondary | ICD-10-CM

## 2016-10-12 DIAGNOSIS — Z283 Underimmunization status: Secondary | ICD-10-CM

## 2016-10-12 MED ORDER — CYCLOBENZAPRINE HCL 10 MG PO TABS: 10.0000 mg | ORAL_TABLET | Freq: Three times a day (TID) | ORAL | 1 refills | Status: DC | PRN

## 2016-10-12 NOTE — Progress Notes (Signed)
Pt complaints of right side hip pain, ?sciatica. Pt would like to know if she can get muscle relaxer Rx. Pt states she is wearing her support belt, not much help at this time. Pt states some pelvic pain and pressure.

## 2016-10-12 NOTE — Progress Notes (Signed)
   PRENATAL VISIT NOTE  Subjective:  Penny Leonard is a 21 y.o. G2P1001 at [redacted]w[redacted]d being seen today for ongoing prenatal care.  She is currently monitored for the following issues for this low-risk pregnancy and has Supervision of other normal pregnancy, antepartum; Maternal varicella, non-immune; and Anemia during pregnancy in third trimester on her problem list.  Patient reports backache, no bleeding, no leaking and states that she is having sciatic pain more on the right side, was better when she is not at work.  Drinking about 3 water bottles a day, encouraged to increase to 8.  .  Contractions: Not present. Vag. Bleeding: None.  Movement: Present. Denies leaking of fluid.   The following portions of the patient's history were reviewed and updated as appropriate: allergies, current medications, past family history, past medical history, past social history, past surgical history and problem list. Problem list updated.  Objective:   Vitals:   10/12/16 1303  BP: 109/70  Pulse: (!) 108  Weight: 224 lb (101.6 kg)    Fetal Status: Fetal Heart Rate (bpm): 143; doppler Fundal Height: 30 cm Movement: Present     General:  Alert, oriented and cooperative. Patient is in no acute distress.  Skin: Skin is warm and dry. No rash noted.   Cardiovascular: Normal heart rate noted  Respiratory: Normal respiratory effort, no problems with respiration noted  Abdomen: Soft, gravid, appropriate for gestational age.  Pain/Pressure: Present     Pelvic: Cervical exam deferred        Extremities: Normal range of motion.     Mental Status:  Normal mood and affect. Normal behavior. Normal judgment and thought content.   Assessment and Plan:  Pregnancy: G2P1001 at [redacted]w[redacted]d  1. Sciatic nerve pain, unspecified laterality     OTC Tylenol, has maternity support belt that she is not using.  - cyclobenzaprine (FLEXERIL) 10 MG tablet; Take 1 tablet (10 mg total) by mouth every 8 (eight) hours as needed for muscle  spasms.  Dispense: 30 tablet; Refill: 1  2. Supervision of other normal pregnancy, antepartum      Normal pregnancy discomforts.  Increased water intake encouraged.   3. Maternal varicella, non-immune     Varicella postpartum  4. Anemia during pregnancy in third trimester      Taking Bloom.   Preterm labor symptoms and general obstetric precautions including but not limited to vaginal bleeding, contractions, leaking of fluid and fetal movement were reviewed in detail with the patient. Please refer to After Visit Summary for other counseling recommendations.  Return in about 2 weeks (around 10/26/2016) for ROB.   Morene Crocker, CNM

## 2016-10-26 ENCOUNTER — Other Ambulatory Visit (HOSPITAL_COMMUNITY)
Admission: RE | Admit: 2016-10-26 | Discharge: 2016-10-26 | Disposition: A | Discharge status: Home / Self Care | Payer: Medicaid Other | Source: Ambulatory Visit | Attending: Certified Nurse Midwife | Admitting: Certified Nurse Midwife

## 2016-10-26 ENCOUNTER — Ambulatory Visit (INDEPENDENT_AMBULATORY_CARE_PROVIDER_SITE_OTHER): Payer: Medicaid Other | Admitting: Certified Nurse Midwife

## 2016-10-26 VITALS — BP 130/80 | HR 96 | Wt 227.0 lb

## 2016-10-26 DIAGNOSIS — O98819 Other maternal infectious and parasitic diseases complicating pregnancy, unspecified trimester: Secondary | ICD-10-CM | POA: Diagnosis not present

## 2016-10-26 DIAGNOSIS — N760 Acute vaginitis: Secondary | ICD-10-CM | POA: Diagnosis not present

## 2016-10-26 DIAGNOSIS — O99013 Anemia complicating pregnancy, third trimester: Secondary | ICD-10-CM

## 2016-10-26 DIAGNOSIS — B9689 Other specified bacterial agents as the cause of diseases classified elsewhere: Secondary | ICD-10-CM | POA: Diagnosis not present

## 2016-10-26 DIAGNOSIS — Z3480 Encounter for supervision of other normal pregnancy, unspecified trimester: Secondary | ICD-10-CM

## 2016-10-26 DIAGNOSIS — Z283 Underimmunization status: Secondary | ICD-10-CM

## 2016-10-26 DIAGNOSIS — O09893 Supervision of other high risk pregnancies, third trimester: Secondary | ICD-10-CM

## 2016-10-26 DIAGNOSIS — O09899 Supervision of other high risk pregnancies, unspecified trimester: Secondary | ICD-10-CM

## 2016-10-26 DIAGNOSIS — Z3483 Encounter for supervision of other normal pregnancy, third trimester: Secondary | ICD-10-CM

## 2016-10-26 DIAGNOSIS — Z3A00 Weeks of gestation of pregnancy not specified: Secondary | ICD-10-CM | POA: Diagnosis not present

## 2016-10-26 NOTE — Progress Notes (Signed)
Patient reports good fetal movement with occasional "pressure/pain in stomach". Pt reports that she sometimes "leaks" and is not sure if it is urine.

## 2016-10-27 LAB — CERVICOVAGINAL ANCILLARY ONLY
Bacterial vaginitis: POSITIVE — AB
Candida vaginitis: NEGATIVE
Chlamydia: NEGATIVE
NEISSERIA GONORRHEA: NEGATIVE
TRICH (WINDOWPATH): NEGATIVE

## 2016-10-27 NOTE — Progress Notes (Signed)
   PRENATAL VISIT NOTE  Subjective:  Penny Leonard is a 21 y.o. G2P1001 at [redacted]w[redacted]d being seen today for ongoing prenatal care.  She is currently monitored for the following issues for this low-risk pregnancy and has Supervision of other normal pregnancy, antepartum; Maternal varicella, non-immune; and Anemia during pregnancy in third trimester on her problem list.  Patient reports no bleeding, no contractions, no cramping, no leaking and vaginal irritation.  Contractions: Irregular. Vag. Bleeding: None.  Movement: Present. Denies leaking of fluid.   The following portions of the patient's history were reviewed and updated as appropriate: allergies, current medications, past family history, past medical history, past social history, past surgical history and problem list. Problem list updated.  Objective:   Vitals:   10/26/16 1347  BP: 130/80  Pulse: 96  Weight: 227 lb (103 kg)    Fetal Status: Fetal Heart Rate (bpm): 145; doppler Fundal Height: 32 cm Movement: Present     General:  Alert, oriented and cooperative. Patient is in no acute distress.  Skin: Skin is warm and dry. No rash noted.   Cardiovascular: Normal heart rate noted  Respiratory: Normal respiratory effort, no problems with respiration noted  Abdomen: Soft, gravid, appropriate for gestational age.  Pain/Pressure: Present     Pelvic: Cervical exam deferred        Extremities: Normal range of motion.  Edema: None  Mental Status:  Normal mood and affect. Normal behavior. Normal judgment and thought content.   Assessment and Plan:  Pregnancy: G2P1001 at [redacted]w[redacted]d  1. Supervision of other normal pregnancy, antepartum      42 lb weight gain this pregnancy.  Diet discussed.  - Cervicovaginal ancillary only  2. Maternal varicella, non-immune     Varicella postpartum  3. Anemia during pregnancy in third trimester     Taking Bloom.   Preterm labor symptoms and general obstetric precautions including but not limited to  vaginal bleeding, contractions, leaking of fluid and fetal movement were reviewed in detail with the patient. Please refer to After Visit Summary for other counseling recommendations.  Return in about 2 weeks (around 11/09/2016) for ROB.   Morene Crocker, CNM

## 2016-10-29 ENCOUNTER — Other Ambulatory Visit: Payer: Self-pay | Admitting: Certified Nurse Midwife

## 2016-10-29 DIAGNOSIS — N760 Acute vaginitis: Principal | ICD-10-CM

## 2016-10-29 DIAGNOSIS — B9689 Other specified bacterial agents as the cause of diseases classified elsewhere: Secondary | ICD-10-CM

## 2016-10-29 MED ORDER — METRONIDAZOLE 500 MG PO TABS: 500.0000 mg | ORAL_TABLET | Freq: Two times a day (BID) | ORAL | 0 refills | Status: DC

## 2016-10-31 ENCOUNTER — Encounter: Payer: Self-pay | Admitting: *Deleted

## 2016-11-09 ENCOUNTER — Ambulatory Visit (INDEPENDENT_AMBULATORY_CARE_PROVIDER_SITE_OTHER): Payer: Medicaid Other | Admitting: Certified Nurse Midwife

## 2016-11-09 VITALS — BP 130/76 | HR 77 | Wt 231.5 lb

## 2016-11-09 DIAGNOSIS — Z3483 Encounter for supervision of other normal pregnancy, third trimester: Secondary | ICD-10-CM

## 2016-11-09 DIAGNOSIS — Z23 Encounter for immunization: Secondary | ICD-10-CM | POA: Diagnosis not present

## 2016-11-09 DIAGNOSIS — O09899 Supervision of other high risk pregnancies, unspecified trimester: Secondary | ICD-10-CM

## 2016-11-09 DIAGNOSIS — Z3480 Encounter for supervision of other normal pregnancy, unspecified trimester: Secondary | ICD-10-CM

## 2016-11-09 DIAGNOSIS — D649 Anemia, unspecified: Secondary | ICD-10-CM

## 2016-11-09 DIAGNOSIS — Z283 Underimmunization status: Secondary | ICD-10-CM

## 2016-11-09 DIAGNOSIS — Z2839 Other underimmunization status: Secondary | ICD-10-CM

## 2016-11-09 DIAGNOSIS — O99013 Anemia complicating pregnancy, third trimester: Secondary | ICD-10-CM

## 2016-11-09 NOTE — Progress Notes (Signed)
Patient reports good fetal movement, denies pain. 

## 2016-11-09 NOTE — Progress Notes (Signed)
   PRENATAL VISIT NOTE  Subjective:  Penny Leonard is a 21 y.o. G2P1001 at [redacted]w[redacted]d being seen today for ongoing prenatal care.  She is currently monitored for the following issues for this low-risk pregnancy and has Supervision of other normal pregnancy, antepartum; Maternal varicella, non-immune; and Anemia during pregnancy in third trimester on her problem list.  Patient reports no complaints.  Contractions: Not present. Vag. Bleeding: None.  Movement: Present. Denies leaking of fluid.   The following portions of the patient's history were reviewed and updated as appropriate: allergies, current medications, past family history, past medical history, past social history, past surgical history and problem list. Problem list updated.  Objective:   Vitals:   11/09/16 1059  BP: 130/76  Pulse: 77  Weight: 231 lb 8 oz (105 kg)    Fetal Status: Fetal Heart Rate (bpm): 142; doppler Fundal Height: 35 cm Movement: Present     General:  Alert, oriented and cooperative. Patient is in no acute distress.  Skin: Skin is warm and dry. No rash noted.   Cardiovascular: Normal heart rate noted  Respiratory: Normal respiratory effort, no problems with respiration noted  Abdomen: Soft, gravid, appropriate for gestational age.  Pain/Pressure: Absent     Pelvic: Cervical exam deferred        Extremities: Normal range of motion.  Edema: None  Mental Status:  Normal mood and affect. Normal behavior. Normal judgment and thought content.   Assessment and Plan:  Pregnancy: G2P1001 at [redacted]w[redacted]d  1. Supervision of other normal pregnancy, antepartum      Doing well.  TDaP given.  - Flu Vaccine QUAD 36+ mos IM (Fluarix, Quad PF)  2. Anemia during pregnancy in third trimester      Taking bloom dailyl.   3. Maternal varicella, non-immune     Varicella postpartum  Preterm labor symptoms and general obstetric precautions including but not limited to vaginal bleeding, contractions, leaking of fluid and fetal  movement were reviewed in detail with the patient. Please refer to After Visit Summary for other counseling recommendations.  Return in about 2 weeks (around 11/23/2016) for ROB, G[place].   Morene Crocker, CNM

## 2016-11-12 ENCOUNTER — Encounter (HOSPITAL_COMMUNITY): Payer: Self-pay

## 2016-11-12 ENCOUNTER — Inpatient Hospital Stay (HOSPITAL_COMMUNITY)
Admission: AD | Admit: 2016-11-12 | Discharge: 2016-11-12 | Disposition: A | Discharge status: Home / Self Care | Payer: Medicaid Other | Source: Ambulatory Visit | Attending: Obstetrics and Gynecology | Admitting: Obstetrics and Gynecology

## 2016-11-12 DIAGNOSIS — N3000 Acute cystitis without hematuria: Secondary | ICD-10-CM | POA: Diagnosis not present

## 2016-11-12 DIAGNOSIS — O26893 Other specified pregnancy related conditions, third trimester: Secondary | ICD-10-CM | POA: Insufficient documentation

## 2016-11-12 DIAGNOSIS — R42 Dizziness and giddiness: Secondary | ICD-10-CM | POA: Diagnosis present

## 2016-11-12 DIAGNOSIS — N898 Other specified noninflammatory disorders of vagina: Secondary | ICD-10-CM | POA: Insufficient documentation

## 2016-11-12 DIAGNOSIS — Z3A34 34 weeks gestation of pregnancy: Secondary | ICD-10-CM | POA: Insufficient documentation

## 2016-11-12 DIAGNOSIS — R109 Unspecified abdominal pain: Secondary | ICD-10-CM | POA: Diagnosis present

## 2016-11-12 LAB — URINALYSIS, ROUTINE W REFLEX MICROSCOPIC
BILIRUBIN URINE: NEGATIVE
Glucose, UA: NEGATIVE mg/dL
Hgb urine dipstick: NEGATIVE
KETONES UR: NEGATIVE mg/dL
Nitrite: POSITIVE — AB
PH: 6 (ref 5.0–8.0)
Protein, ur: 30 mg/dL — AB
Specific Gravity, Urine: 1.016 (ref 1.005–1.030)

## 2016-11-12 MED ORDER — CEPHALEXIN 500 MG PO CAPS: 500.0000 mg | ORAL_CAPSULE | Freq: Four times a day (QID) | ORAL | 0 refills | Status: DC

## 2016-11-12 NOTE — Discharge Instructions (Signed)
Acute Urinary Retention, Female Urinary retention means you are unable to pee completely or at all (empty your bladder). Follow these instructions at home:  Drink enough fluids to keep your pee (urine) clear or pale yellow.  If you are sent home with a tube that drains the bladder (catheter), there will be a drainage bag attached to it. There are two types of bags. One is big that you can wear at night without having to empty it. One is smaller and needs to be emptied more often.  Keep the drainage bag emptied.  Keep the drainage bag lower than the tube.  Only take medicine as told by your doctor. Contact a doctor if:  You have a low-grade fever.  You have spasms or you are leaking pee when you have spasms. Get help right away if:  You have chills or a fever.  Your catheter stops draining pee.  Your catheter falls out.  You have increased bleeding that does not stop after you have rested and increased the amount of fluids you had been drinking. This information is not intended to replace advice given to you by your health care provider. Make sure you discuss any questions you have with your health care provider. Document Released: 07/20/2007 Document Revised: 07/09/2015 Document Reviewed: 07/12/2012 [employer] Interactive Patient Education  2017 [employer]  

## 2016-11-12 NOTE — MAU Provider Note (Signed)
History   G2P1001 @ 34.2 wks in with c/o sharp shooting abd pain associated with fetal movement and becoming weak and dizzy after eating a bowel of cereal today. States Good fetal movement   . Denies ROM or vag bleeding.  CSN: [id number]  Arrival date & time 11/12/16  1718   None     Chief Complaint  Patient presents with  . Abdominal Pain  . Dizziness    HPI  History reviewed. No pertinent past medical history.  Past Surgical History:  Procedure Laterality Date  . WRIST SURGERY      Family History  Problem Relation Age of Onset  . Diabetes Father     Social History  Substance Use Topics  . Smoking status: Light Tobacco Smoker  . Smokeless tobacco: Never Used  . Alcohol use No     Comment: occassional when not pregnant     OB History    Gravida Para Term Preterm AB Living   2 1 1     1    SAB TAB Ectopic Multiple Live Births         0 1      Review of Systems  Constitutional: Negative.   HENT: Negative.   Eyes: Negative.   Respiratory: Negative.   Cardiovascular: Negative.   Gastrointestinal: Positive for abdominal pain.  Endocrine: Negative.   Genitourinary: Negative.   Musculoskeletal: Negative.   Skin: Negative.   Allergic/Immunologic: Negative.   Neurological: Positive for dizziness and light-headedness.  Hematological: Negative.   Psychiatric/Behavioral: Negative.     Allergies  Patient has no known allergies.  Home Medications    BP 119/68 (BP Location: Right Arm)   Pulse 100   Temp 97.9 F (36.6 C) (Oral)   Resp 18   Wt 234 lb 8 oz (106.4 kg)   LMP 03/17/2016 (Approximate)   SpO2 100%   BMI 37.85 kg/m   Physical Exam  Constitutional: She is oriented to person, place, and time. She appears well-developed and well-nourished.  HENT:  Head: Normocephalic.  Eyes: Pupils are equal, round, and reactive to light.  Neck: Normal range of motion.  Cardiovascular: Normal rate, regular rhythm, normal heart sounds and intact distal pulses.    Pulmonary/Chest: Effort normal and breath sounds normal.  Abdominal: Soft. Bowel sounds are normal.  Genitourinary: Vagina normal and uterus normal.  Neurological: She is alert and oriented to person, place, and time. She has normal reflexes.  Skin: Skin is warm and dry.  Psychiatric: She has a normal mood and affect. Her behavior is normal. Judgment and thought content normal.    MAU Course  Procedures (including critical care time)  Labs Reviewed  URINALYSIS, ROUTINE W REFLEX MICROSCOPIC   No results found.   No diagnosis found.    MDM  FHR pattern reactive with 15x15 accels. VSS, SVE cl/th/post/high. Discussed high protein in diet and decrease carbs. Will d/c home not in labor

## 2016-11-12 NOTE — MAU Note (Signed)
When she stands, she gets really dizzy, Started yesterday. Almost passed out yesterday at work, vision started going black.  Sat down for awhile, then went home.   Having sharp pains in abd, from belly button to privates.  Has been going on for a wk.  Pain is getting worse.

## 2016-11-13 LAB — URINE CULTURE

## 2016-11-23 ENCOUNTER — Ambulatory Visit (INDEPENDENT_AMBULATORY_CARE_PROVIDER_SITE_OTHER): Payer: Medicaid Other | Admitting: Certified Nurse Midwife

## 2016-11-23 ENCOUNTER — Other Ambulatory Visit (HOSPITAL_COMMUNITY)
Admission: RE | Admit: 2016-11-23 | Discharge: 2016-11-23 | Disposition: A | Discharge status: Home / Self Care | Payer: Medicaid Other | Source: Ambulatory Visit | Attending: Certified Nurse Midwife | Admitting: Certified Nurse Midwife

## 2016-11-23 ENCOUNTER — Encounter: Payer: Self-pay | Admitting: Certified Nurse Midwife

## 2016-11-23 VITALS — BP 130/76 | HR 98 | Wt 226.0 lb

## 2016-11-23 DIAGNOSIS — Z3483 Encounter for supervision of other normal pregnancy, third trimester: Secondary | ICD-10-CM | POA: Diagnosis not present

## 2016-11-23 DIAGNOSIS — Z3490 Encounter for supervision of normal pregnancy, unspecified, unspecified trimester: Secondary | ICD-10-CM

## 2016-11-23 DIAGNOSIS — O99013 Anemia complicating pregnancy, third trimester: Secondary | ICD-10-CM

## 2016-11-23 DIAGNOSIS — Z283 Underimmunization status: Secondary | ICD-10-CM

## 2016-11-23 DIAGNOSIS — O09899 Supervision of other high risk pregnancies, unspecified trimester: Secondary | ICD-10-CM

## 2016-11-23 DIAGNOSIS — Z3480 Encounter for supervision of other normal pregnancy, unspecified trimester: Secondary | ICD-10-CM

## 2016-11-23 DIAGNOSIS — O09893 Supervision of other high risk pregnancies, third trimester: Secondary | ICD-10-CM

## 2016-11-23 DIAGNOSIS — D649 Anemia, unspecified: Secondary | ICD-10-CM

## 2016-11-23 LAB — OB RESULTS CONSOLE GC/CHLAMYDIA: Gonorrhea: NEGATIVE

## 2016-11-23 NOTE — Progress Notes (Signed)
   PRENATAL VISIT NOTE  Subjective:  Penny Leonard is a 21 y.o. G2P1001 at [redacted]w[redacted]d being seen today for ongoing prenatal care.  She is currently monitored for the following issues for this low-risk pregnancy and has Supervision of other normal pregnancy, antepartum; Maternal varicella, non-immune; and Anemia during pregnancy in third trimester on her problem list.  Patient reports no complaints.  Contractions: Irritability. Vag. Bleeding: None.  Movement: Present. Denies leaking of fluid.   The following portions of the patient's history were reviewed and updated as appropriate: allergies, current medications, past family history, past medical history, past social history, past surgical history and problem list. Problem list updated.  Objective:   Vitals:   11/23/16 1059  BP: 130/76  Pulse: 98  Weight: 226 lb (102.5 kg)    Fetal Status: Fetal Heart Rate (bpm): 145; doppler Fundal Height: 37 cm Movement: Present  Presentation: Vertex  General:  Alert, oriented and cooperative. Patient is in no acute distress.  Skin: Skin is warm and dry. No rash noted.   Cardiovascular: Normal heart rate noted  Respiratory: Normal respiratory effort, no problems with respiration noted  Abdomen: Soft, gravid, appropriate for gestational age.  Pain/Pressure: Present     Pelvic: Cervical exam performed Dilation: 1 Effacement (%): Thick Station: Ballotable  Extremities: Normal range of motion.  Edema: None  Mental Status:  Normal mood and affect. Normal behavior. Normal judgment and thought content.   Assessment and Plan:  Pregnancy: G2P1001 at [redacted]w[redacted]d  1. Prenatal care, antepartum       - Culture, beta strep (group b only)  2. Maternal varicella, non-immune      Varicella postpartum  3. Supervision of other normal pregnancy, antepartum      Doing well - Cervicovaginal ancillary only  4. Anemia during pregnancy in third trimester     Taking Bloom  Preterm labor symptoms and general obstetric  precautions including but not limited to vaginal bleeding, contractions, leaking of fluid and fetal movement were reviewed in detail with the patient. Please refer to After Visit Summary for other counseling recommendations.  Return in about 1 week (around 11/30/2016) for ROB.   Morene Crocker, CNM

## 2016-11-24 LAB — CERVICOVAGINAL ANCILLARY ONLY
Bacterial vaginitis: POSITIVE — AB
CANDIDA VAGINITIS: NEGATIVE
CHLAMYDIA, DNA PROBE: NEGATIVE
NEISSERIA GONORRHEA: NEGATIVE
TRICH (WINDOWPATH): NEGATIVE

## 2016-11-26 LAB — CULTURE, BETA STREP (GROUP B ONLY): Strep Gp B Culture: POSITIVE — AB

## 2016-11-28 ENCOUNTER — Other Ambulatory Visit: Payer: Self-pay | Admitting: Certified Nurse Midwife

## 2016-11-28 ENCOUNTER — Telehealth: Payer: Self-pay

## 2016-11-28 DIAGNOSIS — B951 Streptococcus, group B, as the cause of diseases classified elsewhere: Secondary | ICD-10-CM

## 2016-11-28 DIAGNOSIS — Z3480 Encounter for supervision of other normal pregnancy, unspecified trimester: Secondary | ICD-10-CM

## 2016-11-28 DIAGNOSIS — B3731 Acute candidiasis of vulva and vagina: Secondary | ICD-10-CM

## 2016-11-28 DIAGNOSIS — B373 Candidiasis of vulva and vagina: Secondary | ICD-10-CM

## 2016-11-28 DIAGNOSIS — B9689 Other specified bacterial agents as the cause of diseases classified elsewhere: Secondary | ICD-10-CM

## 2016-11-28 DIAGNOSIS — N760 Acute vaginitis: Principal | ICD-10-CM

## 2016-11-28 MED ORDER — FLUCONAZOLE 150 MG PO TABS: 150.0000 mg | ORAL_TABLET | Freq: Once | ORAL | 0 refills | Status: AC

## 2016-11-28 MED ORDER — METRONIDAZOLE 500 MG PO TABS: 500.0000 mg | ORAL_TABLET | Freq: Two times a day (BID) | ORAL | 0 refills | Status: DC

## 2016-11-28 MED ORDER — TERCONAZOLE 0.8 % VA CREA: 1.0000 | TOPICAL_CREAM | Freq: Every day | VAGINAL | 0 refills | Status: DC

## 2016-11-28 NOTE — Telephone Encounter (Signed)
Patient notified of results and Rx. 

## 2016-11-28 NOTE — Telephone Encounter (Signed)
-----   Message from Morene Crocker, CNM sent at 11/28/2016  8:45 AM EDT ----- Please let her know that she has BV, & yeast.  An Rx for Flagyl has been sent to the pharmacy for the BV. I have also sent Diflucan and terconazole cream to the pharmacy for the yeast infection. Thank you.  R.Denney CNM

## 2016-11-30 ENCOUNTER — Ambulatory Visit (INDEPENDENT_AMBULATORY_CARE_PROVIDER_SITE_OTHER): Payer: Medicaid Other | Admitting: Certified Nurse Midwife

## 2016-11-30 VITALS — BP 114/73 | HR 121 | Wt 231.3 lb

## 2016-11-30 DIAGNOSIS — Z283 Underimmunization status: Secondary | ICD-10-CM

## 2016-11-30 DIAGNOSIS — O09893 Supervision of other high risk pregnancies, third trimester: Secondary | ICD-10-CM

## 2016-11-30 DIAGNOSIS — Z3480 Encounter for supervision of other normal pregnancy, unspecified trimester: Secondary | ICD-10-CM

## 2016-11-30 DIAGNOSIS — D649 Anemia, unspecified: Secondary | ICD-10-CM

## 2016-11-30 DIAGNOSIS — O99013 Anemia complicating pregnancy, third trimester: Secondary | ICD-10-CM

## 2016-11-30 DIAGNOSIS — Z2839 Other underimmunization status: Secondary | ICD-10-CM

## 2016-11-30 DIAGNOSIS — B951 Streptococcus, group B, as the cause of diseases classified elsewhere: Secondary | ICD-10-CM

## 2016-11-30 DIAGNOSIS — O09899 Supervision of other high risk pregnancies, unspecified trimester: Secondary | ICD-10-CM

## 2016-11-30 NOTE — Progress Notes (Signed)
   PRENATAL VISIT NOTE  Subjective:  Penny Leonard is a 21 y.o. G2P1001 at [redacted]w[redacted]d being seen today for ongoing prenatal care.  She is currently monitored for the following issues for this low-risk pregnancy and has Supervision of other normal pregnancy, antepartum; Maternal varicella, non-immune; Anemia during pregnancy in third trimester; and Positive GBS test on her problem list.  Patient reports no complaints.  Contractions: Irregular. Vag. Bleeding: None.  Movement: Present. Denies leaking of fluid.   The following portions of the patient's history were reviewed and updated as appropriate: allergies, current medications, past family history, past medical history, past social history, past surgical history and problem list. Problem list updated.  Objective:   Vitals:   11/30/16 1327  BP: 114/73  Pulse: (!) 121  Weight: 231 lb 4.8 oz (104.9 kg)    Fetal Status: Fetal Heart Rate (bpm): 148; doppler Fundal Height: 41 cm Movement: Present     General:  Alert, oriented and cooperative. Patient is in no acute distress.  Skin: Skin is warm and dry. No rash noted.   Cardiovascular: Normal heart rate noted  Respiratory: Normal respiratory effort, no problems with respiration noted  Abdomen: Soft, gravid, appropriate for gestational age.  Pain/Pressure: Present     Pelvic: Cervical exam deferred        Extremities: Normal range of motion.  Edema: Trace  Mental Status:  Normal mood and affect. Normal behavior. Normal judgment and thought content.   Assessment and Plan:  Pregnancy: G2P1001 at [redacted]w[redacted]d  1. Anemia during pregnancy in third trimester     Taking Bloom  2. Maternal varicella, non-immune     Varicella postpartum  3. Positive GBS test     PCN for labor/delivery  4. Supervision of other normal pregnancy, antepartum     Doing well.   Preterm labor symptoms and general obstetric precautions including but not limited to vaginal bleeding, contractions, leaking of fluid and  fetal movement were reviewed in detail with the patient. Please refer to After Visit Summary for other counseling recommendations.  Return in about 1 week (around 12/07/2016) for ROB.   Morene Crocker, CNM

## 2016-11-30 NOTE — Progress Notes (Signed)
Patient reports good fetal movement with irregular contractions.  

## 2016-12-07 ENCOUNTER — Ambulatory Visit (INDEPENDENT_AMBULATORY_CARE_PROVIDER_SITE_OTHER): Payer: Medicaid Other | Admitting: Certified Nurse Midwife

## 2016-12-07 VITALS — BP 126/82 | HR 108 | Wt 236.2 lb

## 2016-12-07 DIAGNOSIS — Z3480 Encounter for supervision of other normal pregnancy, unspecified trimester: Secondary | ICD-10-CM

## 2016-12-07 DIAGNOSIS — O09899 Supervision of other high risk pregnancies, unspecified trimester: Secondary | ICD-10-CM

## 2016-12-07 DIAGNOSIS — Z283 Underimmunization status: Secondary | ICD-10-CM

## 2016-12-07 DIAGNOSIS — B951 Streptococcus, group B, as the cause of diseases classified elsewhere: Secondary | ICD-10-CM

## 2016-12-07 DIAGNOSIS — O09893 Supervision of other high risk pregnancies, third trimester: Secondary | ICD-10-CM

## 2016-12-07 DIAGNOSIS — O99013 Anemia complicating pregnancy, third trimester: Secondary | ICD-10-CM

## 2016-12-07 DIAGNOSIS — D649 Anemia, unspecified: Secondary | ICD-10-CM

## 2016-12-07 NOTE — Progress Notes (Signed)
Patient reports good fetal movement with pressure and contractions that come and go.

## 2016-12-07 NOTE — Progress Notes (Signed)
   PRENATAL VISIT NOTE  Subjective:  Penny Leonard is a 21 y.o. G2P1001 at [redacted]w[redacted]d being seen today for ongoing prenatal care.  She is currently monitored for the following issues for this low-risk pregnancy and has Supervision of other normal pregnancy, antepartum; Maternal varicella, non-immune; Anemia during pregnancy in third trimester; and Positive GBS test on her problem list.  Patient reports no complaints.  Contractions: Irregular. Vag. Bleeding: None.  Movement: Present. Denies leaking of fluid.   The following portions of the patient's history were reviewed and updated as appropriate: allergies, current medications, past family history, past medical history, past social history, past surgical history and problem list. Problem list updated.  Objective:   Vitals:   12/07/16 1432  BP: 126/82  Pulse: (!) 108  Weight: 236 lb 3.2 oz (107.1 kg)    Fetal Status: Fetal Heart Rate (bpm): 148; doppler Fundal Height: 39 cm Movement: Present  Presentation: Vertex  General:  Alert, oriented and cooperative. Patient is in no acute distress.  Skin: Skin is warm and dry. No rash noted.   Cardiovascular: Normal heart rate noted  Respiratory: Normal respiratory effort, no problems with respiration noted  Abdomen: Soft, gravid, appropriate for gestational age.  Pain/Pressure: Present     Pelvic: Cervical exam performed Dilation: Fingertip Effacement (%): Thick Station: Ballotable  Extremities: Normal range of motion.  Edema: Trace  Mental Status:  Normal mood and affect. Normal behavior. Normal judgment and thought content.   Assessment and Plan:  Pregnancy: G2P1001 at [redacted]w[redacted]d  1. Supervision of other normal pregnancy, antepartum     Doing well.   2. Maternal varicella, non-immune     Varicella postpartum  3. Anemia during pregnancy in third trimester     Taking Bloom.   4. Positive GBS test     PCN for labor/delivery  Term labor symptoms and general obstetric precautions including  but not limited to vaginal bleeding, contractions, leaking of fluid and fetal movement were reviewed in detail with the patient. Please refer to After Visit Summary for other counseling recommendations.  Return in about 1 week (around 12/14/2016) for ROB.   Morene Crocker, CNM

## 2016-12-14 ENCOUNTER — Ambulatory Visit (INDEPENDENT_AMBULATORY_CARE_PROVIDER_SITE_OTHER): Payer: Medicaid Other | Admitting: Certified Nurse Midwife

## 2016-12-14 ENCOUNTER — Encounter: Payer: Self-pay | Admitting: Certified Nurse Midwife

## 2016-12-14 VITALS — BP 124/76 | HR 94 | Wt 236.2 lb

## 2016-12-14 DIAGNOSIS — O26843 Uterine size-date discrepancy, third trimester: Secondary | ICD-10-CM

## 2016-12-14 DIAGNOSIS — Z3480 Encounter for supervision of other normal pregnancy, unspecified trimester: Secondary | ICD-10-CM

## 2016-12-14 DIAGNOSIS — O09899 Supervision of other high risk pregnancies, unspecified trimester: Secondary | ICD-10-CM

## 2016-12-14 DIAGNOSIS — O99013 Anemia complicating pregnancy, third trimester: Secondary | ICD-10-CM

## 2016-12-14 DIAGNOSIS — Z283 Underimmunization status: Secondary | ICD-10-CM

## 2016-12-14 NOTE — Progress Notes (Signed)
Patient reports good fetal movement, denies contractions.

## 2016-12-14 NOTE — Progress Notes (Signed)
   PRENATAL VISIT NOTE  Subjective:  Penny Leonard is a 21 y.o. G2P1001 at [redacted]w[redacted]d being seen today for ongoing prenatal care.  She is currently monitored for the following issues for this low-risk pregnancy and has Supervision of other normal pregnancy, antepartum; Maternal varicella, non-immune; Anemia during pregnancy in third trimester; and Positive GBS test on her problem list.  Patient reports backache, no bleeding, no cramping, no leaking and occasional contractions.  Contractions: Not present. Vag. Bleeding: None.  Movement: Present. Denies leaking of fluid.   The following portions of the patient's history were reviewed and updated as appropriate: allergies, current medications, past family history, past medical history, past social history, past surgical history and problem list. Problem list updated.  Objective:   Vitals:   12/14/16 1117  BP: 124/76  Pulse: 94  Weight: 236 lb 3.2 oz (107.1 kg)    Fetal Status:     Movement: Present     General:  Alert, oriented and cooperative. Patient is in no acute distress.  Skin: Skin is warm and dry. No rash noted.   Cardiovascular: Normal heart rate noted  Respiratory: Normal respiratory effort, no problems with respiration noted  Abdomen: Soft, gravid, appropriate for gestational age.  Pain/Pressure: Absent     Pelvic: Cervical exam deferred        Extremities: Normal range of motion.  Edema: Trace  Mental Status:  Normal mood and affect. Normal behavior. Normal judgment and thought content.   Assessment and Plan:  Pregnancy: G2P1001 at [redacted]w[redacted]d  1. Supervision of other normal pregnancy, antepartum     Doing well.  IOL scheduled for 41 weeks.   2. Maternal varicella, non-immune     Varicella postpartum  3. Anemia during pregnancy in third trimester      Taking Bloom  4. Uterine size date discrepancy pregnancy, third trimester      S>D - [place] MFM OB FOLLOW UP; Future  Term labor symptoms and general obstetric precautions  including but not limited to vaginal bleeding, contractions, leaking of fluid and fetal movement were reviewed in detail with the patient. Please refer to After Visit Summary for other counseling recommendations.  No Follow-up on file.   Morene Crocker, CNM

## 2016-12-15 ENCOUNTER — Encounter (HOSPITAL_COMMUNITY): Payer: Self-pay

## 2016-12-15 ENCOUNTER — Ambulatory Visit (HOSPITAL_COMMUNITY)
Admission: RE | Admit: 2016-12-15 | Discharge: 2016-12-15 | Disposition: A | Discharge status: Home / Self Care | Payer: Medicaid Other | Source: Ambulatory Visit | Attending: Certified Nurse Midwife | Admitting: Certified Nurse Midwife

## 2016-12-15 DIAGNOSIS — O26843 Uterine size-date discrepancy, third trimester: Secondary | ICD-10-CM | POA: Diagnosis not present

## 2016-12-15 DIAGNOSIS — Z3A39 39 weeks gestation of pregnancy: Secondary | ICD-10-CM | POA: Diagnosis not present

## 2016-12-15 DIAGNOSIS — O99213 Obesity complicating pregnancy, third trimester: Secondary | ICD-10-CM | POA: Diagnosis not present

## 2016-12-19 ENCOUNTER — Telehealth (HOSPITAL_COMMUNITY): Payer: Self-pay | Admitting: *Deleted

## 2016-12-19 ENCOUNTER — Telehealth: Payer: Self-pay | Admitting: *Deleted

## 2016-12-19 ENCOUNTER — Other Ambulatory Visit: Payer: Self-pay | Admitting: Certified Nurse Midwife

## 2016-12-19 NOTE — Telephone Encounter (Signed)
Pt called to office to f/u on u/s from last week. Pt would like to know what plan for delivery is, pt has appt this week in office.  Please review u/s and give recommendation for delivery.

## 2016-12-19 NOTE — Telephone Encounter (Signed)
Preadmission screen  

## 2016-12-19 NOTE — Telephone Encounter (Signed)
Call placed to pt to make her aware that IOL forms to be faxed and scheduled. She should expect call with date and time.

## 2016-12-19 NOTE — Telephone Encounter (Signed)
IOL paperwork completed per patient request after meeting with MFM.   R.Eldana Isip CNM

## 2016-12-20 ENCOUNTER — Encounter (HOSPITAL_COMMUNITY): Payer: Self-pay | Admitting: Certified Registered Nurse Anesthetist

## 2016-12-20 ENCOUNTER — Inpatient Hospital Stay (HOSPITAL_COMMUNITY): Payer: Medicaid Other | Admitting: Anesthesiology

## 2016-12-20 ENCOUNTER — Other Ambulatory Visit: Payer: Self-pay

## 2016-12-20 ENCOUNTER — Encounter (HOSPITAL_COMMUNITY): Payer: Self-pay

## 2016-12-20 ENCOUNTER — Inpatient Hospital Stay (HOSPITAL_COMMUNITY)
Admission: RE | Admit: 2016-12-20 | Discharge: 2016-12-22 | DRG: 806 | Disposition: A | Discharge status: Home / Self Care | Payer: Medicaid Other | Source: Ambulatory Visit | Attending: Family Medicine | Admitting: Family Medicine

## 2016-12-20 DIAGNOSIS — O99334 Smoking (tobacco) complicating childbirth: Secondary | ICD-10-CM | POA: Diagnosis present

## 2016-12-20 DIAGNOSIS — F17200 Nicotine dependence, unspecified, uncomplicated: Secondary | ICD-10-CM | POA: Diagnosis present

## 2016-12-20 DIAGNOSIS — O3663X0 Maternal care for excessive fetal growth, third trimester, not applicable or unspecified: Secondary | ICD-10-CM | POA: Diagnosis present

## 2016-12-20 DIAGNOSIS — O99824 Streptococcus B carrier state complicating childbirth: Secondary | ICD-10-CM | POA: Diagnosis present

## 2016-12-20 DIAGNOSIS — F1290 Cannabis use, unspecified, uncomplicated: Secondary | ICD-10-CM | POA: Diagnosis present

## 2016-12-20 DIAGNOSIS — O9902 Anemia complicating childbirth: Secondary | ICD-10-CM | POA: Diagnosis present

## 2016-12-20 DIAGNOSIS — D649 Anemia, unspecified: Secondary | ICD-10-CM | POA: Diagnosis present

## 2016-12-20 DIAGNOSIS — O3660X0 Maternal care for excessive fetal growth, unspecified trimester, not applicable or unspecified: Secondary | ICD-10-CM

## 2016-12-20 DIAGNOSIS — O99324 Drug use complicating childbirth: Secondary | ICD-10-CM | POA: Diagnosis present

## 2016-12-20 DIAGNOSIS — Z3A39 39 weeks gestation of pregnancy: Secondary | ICD-10-CM | POA: Diagnosis not present

## 2016-12-20 LAB — OB RESULTS CONSOLE GBS: GBS: POSITIVE

## 2016-12-20 LAB — CBC
HEMATOCRIT: 30.9 % — AB (ref 36.0–46.0)
HEMOGLOBIN: 9.8 g/dL — AB (ref 12.0–15.0)
MCH: 25.9 pg — ABNORMAL LOW (ref 26.0–34.0)
MCHC: 31.7 g/dL (ref 30.0–36.0)
MCV: 81.5 fL (ref 78.0–100.0)
Platelets: 207 10*3/uL (ref 150–400)
RBC: 3.79 MIL/uL — ABNORMAL LOW (ref 3.87–5.11)
RDW: 16 % — ABNORMAL HIGH (ref 11.5–15.5)
WBC: 8.9 10*3/uL (ref 4.0–10.5)

## 2016-12-20 LAB — TYPE AND SCREEN
ABO/RH(D): O POS
ANTIBODY SCREEN: NEGATIVE

## 2016-12-20 MED ORDER — OXYTOCIN BOLUS FROM INFUSION
500.0000 mL | Freq: Once | INTRAVENOUS | Status: AC
  Administered 2016-12-21: 500 mL via INTRAVENOUS

## 2016-12-20 MED ORDER — ONDANSETRON HCL 4 MG/2ML IJ SOLN: 4.0000 mg | Freq: Four times a day (QID) | INTRAMUSCULAR | Status: DC | PRN

## 2016-12-20 MED ORDER — FENTANYL CITRATE (PF) 100 MCG/2ML IJ SOLN
100.0000 ug | INTRAMUSCULAR | Status: DC | PRN
  Administered 2016-12-20: 100 ug via INTRAVENOUS
  Filled 2016-12-20: qty 2

## 2016-12-20 MED ORDER — LACTATED RINGERS IV SOLN: 500.0000 mL | Freq: Once | INTRAVENOUS | Status: DC

## 2016-12-20 MED ORDER — TERBUTALINE SULFATE 1 MG/ML IJ SOLN
0.2500 mg | Freq: Once | INTRAMUSCULAR | Status: AC | PRN
  Administered 2016-12-20: 0.25 mg via SUBCUTANEOUS
  Filled 2016-12-20: qty 1

## 2016-12-20 MED ORDER — FLEET ENEMA 7-19 GM/118ML RE ENEM: 1.0000 | ENEMA | RECTAL | Status: DC | PRN

## 2016-12-20 MED ORDER — LIDOCAINE HCL (PF) 1 % IJ SOLN
30.0000 mL | INTRAMUSCULAR | Status: DC | PRN
  Filled 2016-12-20: qty 30

## 2016-12-20 MED ORDER — PHENYLEPHRINE 40 MCG/ML (10ML) SYRINGE FOR IV PUSH (FOR BLOOD PRESSURE SUPPORT)
80.0000 ug | PREFILLED_SYRINGE | INTRAVENOUS | Status: DC | PRN
  Administered 2016-12-21: 80 ug via INTRAVENOUS
  Filled 2016-12-20: qty 5

## 2016-12-20 MED ORDER — OXYCODONE-ACETAMINOPHEN 5-325 MG PO TABS: 1.0000 | ORAL_TABLET | ORAL | Status: DC | PRN

## 2016-12-20 MED ORDER — SOD CITRATE-CITRIC ACID 500-334 MG/5ML PO SOLN
30.0000 mL | ORAL | Status: DC | PRN
  Administered 2016-12-20: 30 mL via ORAL
  Filled 2016-12-20: qty 15

## 2016-12-20 MED ORDER — PHENYLEPHRINE 40 MCG/ML (10ML) SYRINGE FOR IV PUSH (FOR BLOOD PRESSURE SUPPORT)
80.0000 ug | PREFILLED_SYRINGE | INTRAVENOUS | Status: DC | PRN
  Filled 2016-12-20 (×2): qty 10
  Filled 2016-12-20: qty 5

## 2016-12-20 MED ORDER — EPHEDRINE 5 MG/ML INJ
10.0000 mg | INTRAVENOUS | Status: DC | PRN
  Filled 2016-12-20: qty 2

## 2016-12-20 MED ORDER — DIPHENHYDRAMINE HCL 50 MG/ML IJ SOLN: 12.5000 mg | INTRAMUSCULAR | Status: DC | PRN

## 2016-12-20 MED ORDER — FENTANYL 2.5 MCG/ML BUPIVACAINE 1/10 % EPIDURAL INFUSION (WH - ANES)
14.0000 mL/h | INTRAMUSCULAR | Status: DC | PRN
  Administered 2016-12-20 (×2): 14 mL/h via EPIDURAL
  Filled 2016-12-20 (×2): qty 100

## 2016-12-20 MED ORDER — OXYTOCIN 40 UNITS IN LACTATED RINGERS INFUSION - SIMPLE MED
1.0000 m[IU]/min | INTRAVENOUS | Status: DC
  Administered 2016-12-20 (×2): 2 m[IU]/min via INTRAVENOUS
  Filled 2016-12-20: qty 1000

## 2016-12-20 MED ORDER — LIDOCAINE HCL (PF) 1 % IJ SOLN
INTRAMUSCULAR | Status: DC | PRN
  Administered 2016-12-20 (×2): 4 mL via EPIDURAL

## 2016-12-20 MED ORDER — LACTATED RINGERS IV SOLN
500.0000 mL | Freq: Once | INTRAVENOUS | Status: AC
  Administered 2016-12-20: 500 mL via INTRAVENOUS

## 2016-12-20 MED ORDER — PENICILLIN G POT IN DEXTROSE 60000 UNIT/ML IV SOLN
3.0000 10*6.[IU] | INTRAVENOUS | Status: DC
  Administered 2016-12-20 (×3): 3 10*6.[IU] via INTRAVENOUS
  Filled 2016-12-20 (×7): qty 50

## 2016-12-20 MED ORDER — PENICILLIN G POTASSIUM 5000000 UNITS IJ SOLR
5.0000 10*6.[IU] | Freq: Once | INTRAVENOUS | Status: AC
  Administered 2016-12-20: 5 10*6.[IU] via INTRAVENOUS
  Filled 2016-12-20: qty 5

## 2016-12-20 MED ORDER — OXYTOCIN 40 UNITS IN LACTATED RINGERS INFUSION - SIMPLE MED
2.5000 [IU]/h | INTRAVENOUS | Status: DC
  Administered 2016-12-21: 2.5 [IU]/h via INTRAVENOUS

## 2016-12-20 MED ORDER — ACETAMINOPHEN 325 MG PO TABS: 650.0000 mg | ORAL_TABLET | ORAL | Status: DC | PRN

## 2016-12-20 MED ORDER — OXYCODONE-ACETAMINOPHEN 5-325 MG PO TABS: 2.0000 | ORAL_TABLET | ORAL | Status: DC | PRN

## 2016-12-20 MED ORDER — LACTATED RINGERS IV SOLN
INTRAVENOUS | Status: DC
  Administered 2016-12-20: 09:00:00 via INTRAVENOUS

## 2016-12-20 MED ORDER — LACTATED RINGERS IV SOLN
500.0000 mL | INTRAVENOUS | Status: DC | PRN
  Administered 2016-12-20: 1000 mL via INTRAVENOUS

## 2016-12-20 NOTE — Anesthesia Pain Management Evaluation Note (Signed)
  CRNA Pain Management Visit Note  Patient: Penny Leonard, 21 y.o., female  "Hello I am a member of the anesthesia team at [facility]. We have an anesthesia team available at all times to provide care throughout the hospital, including epidural management and anesthesia for C-section. I don't know your plan for the delivery whether it a natural birth, water birth, IV sedation, nitrous supplementation, doula or epidural, but we want to meet your pain goals."   1.Was your pain managed to your expectations on prior hospitalizations?   Yes   2.What is your expectation for pain management during this hospitalization?     Epidural  3.How can we help you reach that goal?   Record the patient's initial score and the patient's pain goal.   Pain: 0  Pain Goal: 7 [facility] wants you to be able to say your pain was always managed very well.  Jabier Mutton 12/20/2016

## 2016-12-20 NOTE — Anesthesia Preprocedure Evaluation (Signed)
Anesthesia Evaluation  Patient identified by MRN, date of birth, ID band Patient awake    Reviewed: Allergy & Precautions, Patient's Chart, lab work & pertinent test results  Airway Mallampati: III  TM Distance: >3 FB Neck ROM: Full    Dental no notable dental hx. (+) Teeth Intact   Pulmonary Current Smoker,    Pulmonary exam normal breath sounds clear to auscultation       Cardiovascular negative cardio ROS Normal cardiovascular exam Rhythm:Regular Rate:Normal     Neuro/Psych negative neurological ROS  negative psych ROS   GI/Hepatic Neg liver ROS, GERD  ,(+)     substance abuse  marijuana use,   Endo/Other  Obesity  Renal/GU negative Renal ROS  negative genitourinary   Musculoskeletal negative musculoskeletal ROS (+)   Abdominal (+) + obese,   Peds  Hematology  (+) anemia ,   Anesthesia Other Findings   Reproductive/Obstetrics                             Anesthesia Physical Anesthesia Plan  ASA: II  Anesthesia Plan: Epidural   Post-op Pain Management:    Induction:   PONV Risk Score and Plan: 1  Airway Management Planned: Natural Airway  Additional Equipment:   Intra-op Plan:   Post-operative Plan:   Informed Consent: I have reviewed the patients History and Physical, chart, labs and discussed the procedure including the risks, benefits and alternatives for the proposed anesthesia with the patient or authorized representative who has indicated his/her understanding and acceptance.     Plan Discussed with: Anesthesiologist  Anesthesia Plan Comments:         Anesthesia Quick Evaluation

## 2016-12-20 NOTE — Progress Notes (Signed)
Labor Progress Note Penny Leonard is a 21 y.o. G2P1001 at [redacted]w[redacted]d presented for IOL for LGA S: Patient comfortable with increased pressure.  O:  BP 124/75   Pulse (!) 108   Temp 98.9 F (37.2 C)   Resp 18   Ht 5\' 6"  (1.676 m)   Wt 233 lb (105.7 kg)   LMP 03/17/2016 (Approximate)   SpO2 100%   BMI 37.61 kg/m  EFM: 145/mod vari/+accels  CVE: Dilation: 8 Effacement (%): 80 Cervical Position: Middle Station: 0 Presentation: Vertex Exam by:: Gailen Shelter RN    A&P: 21 y.o. G2P1001 [redacted]w[redacted]d here for IOL for LGA #Labor: Progressing well. IUPC placed will restart pitocin #Pain: epidural #FWB: cat 1 #GBS positive PCN   Ovidio Kin, MD 10:23 PM

## 2016-12-20 NOTE — Progress Notes (Signed)
Labor Progress Note LASHELLE KOY is a 21 y.o. G2P1001 at [redacted]w[redacted]d presented for IOL for LGA. S: Comfortable with epidural  O:  BP 113/74   Pulse 80   Temp 97.8 F (36.6 C) (Oral)   Resp 18   Ht 5\' 6"  (1.676 m)   Wt 233 lb (105.7 kg)   LMP 03/17/2016 (Approximate)   BMI 37.61 kg/m  EFM: 150/mod var/pos acels/no decels  CVE: Dilation: 7 Effacement (%): 80 Cervical Position: Middle Station: 0 Presentation: Vertex Exam by:: Mikey College, RN   A&P: 21 y.o. G2P1001 [redacted]w[redacted]d here for IOL for LGA #Labor: Progressing well. AROM apparently successful at last attempt because has been leaking clear fluid. Continue pitocin. Anticipate SVD. #Pain: epidural #FWB: cat 1   Rishon Thilges, DO 6:03 PM

## 2016-12-20 NOTE — Anesthesia Procedure Notes (Signed)
Epidural Patient location during procedure: OB Start time: 12/20/2016 3:34 PM  Staffing Anesthesiologist: Josephine Igo, MD Performed: anesthesiologist   Preanesthetic Checklist Completed: patient identified, site marked, surgical consent, pre-op evaluation, timeout performed, IV checked, risks and benefits discussed and monitors and equipment checked  Epidural Patient position: sitting Prep: site prepped and draped and DuraPrep Patient monitoring: continuous pulse ox and blood pressure Approach: midline Location: L3-L4 Injection technique: LOR air  Needle:  Needle type: Tuohy  Needle gauge: 17 G Needle length: 9 cm and 9 Needle insertion depth: 5 cm cm Catheter type: closed end flexible Catheter size: 19 Gauge Catheter at skin depth: 14 cm Test dose: negative and Other  Assessment Events: blood not aspirated, injection not painful, no injection resistance, negative IV test and no paresthesia  Additional Notes Patient identified. Risks and benefits discussed including failed block, incomplete  Pain control, post dural puncture headache, nerve damage, paralysis, blood pressure Changes, nausea, vomiting, reactions to medications-both toxic and allergic and post Partum back pain. All questions were answered. Patient expressed understanding and wished to proceed. Sterile technique was used throughout procedure. Epidural site was Dressed with sterile barrier dressing. No paresthesias, signs of intravascular injection Or signs of intrathecal spread were encountered.  Patient was more comfortable after the epidural was dosed. Please see RN's note for documentation of vital signs and FHR which are stable.

## 2016-12-20 NOTE — Progress Notes (Signed)
Labor Progress Note TUERE NWOSU is a 21 y.o. G2P1001 at [redacted]w[redacted]d presented for IOL for LGA S: No complaints  O:  BP 114/71   Pulse 82   Temp 97.8 F (36.6 C) (Oral)   Ht 5\' 6"  (1.676 m)   Wt 233 lb (105.7 kg)   LMP 03/17/2016 (Approximate)   BMI 37.61 kg/m  EFM: 150/mod var/pos acels/no decels  CVE: Dilation: 5 Effacement (%): 70 Station: -2 Presentation: Vertex Exam by:: Dr. Manus Rudd   A&P: 21 y.o. G2P1001 [redacted]w[redacted]d here for IOL for LGA #Labor: Progressing well. Attempted AROM but unsuccessful because patient was uncomfortable during procedure. Will get epidural, then attempt again #Pain: planning epidural #FWB: cat 1 #GBS positive- given 2nd dose PCN   Eulice Rutledge, DO 2:23 PM

## 2016-12-20 NOTE — H&P (Signed)
Obstetric History and Physical  Penny Leonard is a 21 y.o. G2P1001 with IUP at [redacted]w[redacted]d presenting for IOL for LGA, EFW >90th% at 4696g on 12/15/2016. Patient states she has been having  none contractions, none vaginal bleeding, intact membranes, with active fetal movement.    Prenatal Course Source of Care: [facility]  Pregnancy complications or risks: Patient Active Problem List   Diagnosis Date Noted  . LGA (large for gestational age) fetus affecting management of mother 12/20/2016  . Positive GBS test 11/28/2016  . Anemia during pregnancy in third trimester 10/03/2016  . Maternal varicella, non-immune 08/03/2016  . Supervision of other normal pregnancy, antepartum 08/01/2016   She plans to breastfeed She desires undecided for postpartum contraception.    Clinic [facility] Prenatal Labs  Dating Early [place] Blood type: O/Positive/-- (06/18 1550)   Genetic Screen  AFP:       NIPS:mat 21: normal Antibody:Negative (06/18 1550)  Anatomic [place] Female fetus; normal @21  weeks Rubella: 1.70 (06/18 1550)  GTT  Third trimester: WNL RPR: Non Reactive (08/15 1050)   Flu vaccine 11-09-16 HBsAg: Negative (06/18 1550)   TDaP vaccine 11-09-16 HIV:   NR  Baby Food     Breast/Bottle                                          GBS: POSITIVE  Contraception Does not want BC Pap:08/03/16: normal  Circumcision Yes,  CF: negative  Pediatrician [facility]   Support Person    Prenatal Classes      Prenatal Transfer Tool  Maternal Diabetes: No Genetic Screening: Normal Maternal Ultrasounds/Referrals: Abnormal:  Findings:   Other: LGA Fetal Ultrasounds or other Referrals:  None Maternal Substance Abuse:  No Significant Maternal Medications:  None Significant Maternal Lab Results: Lab values include: Group B Strep positive  History reviewed. No pertinent past medical history.  Past Surgical History:  Procedure Laterality Date  . WRIST SURGERY      OB History  Gravida Para Term Preterm AB Living  2 1 1     1    SAB TAB Ectopic Multiple Live Births        0 1    # Outcome Date GA Lbr Len/2nd Weight Sex Delivery Anes PTL Lv  2 Current           1 Term 07/18/14 [redacted]w[redacted]d 14:05 / 00:37 7 lb 9.4 oz (3.442 kg) F Vag-Spont EPI  LIV     Birth Comments: [id number] hugs 9      Social History   Socioeconomic History  . Marital status: Single    Spouse name: None  . Number of children: None  . Years of education: None  . Highest education level: None  Social Needs  . Financial resource strain: None  . Food insecurity - worry: None  . Food insecurity - inability: None  . Transportation needs - medical: None  . Transportation needs - non-medical: None  Occupational History  . None  Tobacco Use  . Smoking status: Light Tobacco Smoker  . Smokeless tobacco: Never Used  Substance and Sexual Activity  . Alcohol use: No    Comment: occassional when not pregnant   . Drug use: Yes    Types: Marijuana    Comment: last used 2 weeks ago  . Sexual activity: Yes    Birth control/protection: None  Other Topics Concern  . None  Social History Narrative  . None    Family History  Problem Relation Age of Onset  . Diabetes Father     Medications Prior to Admission  Medication Sig Dispense Refill Last Dose  . Prenatal Vit-Fe Fumarate-FA (MULTIVITAMIN-PRENATAL) 27-0.8 MG TABS tablet Take 1 tablet by mouth daily at 12 noon.   Taking  . Prenatal-DSS-FeCb-FeGl-FA (CITRANATAL BLOOM) 90-1 MG TABS Take 1 tablet by mouth daily. 30 tablet 12 Taking    No Known Allergies  Review of Systems: Negative except for what is mentioned in HPI.  Physical Exam: Temp 98.3 F (36.8 C) (Oral)   Ht 5\' 6"  (1.676 m)   Wt 233 lb (105.7 kg)   LMP 03/17/2016 (Approximate)   BMI 37.61 kg/m  CONSTITUTIONAL: Well-developed, well-nourished female in no acute distress.  HENT:  Normocephalic, atraumatic, External right and left ear normal. Oropharynx is clear and moist EYES: Conjunctivae and EOM are normal. Pupils are equal,  round, and reactive to light. No scleral icterus.  NECK: Normal range of motion, supple, no masses SKIN: Skin is warm and dry. No rash noted. Not diaphoretic. No erythema. No pallor. NEUROLOGIC: Alert and oriented to person, place, and time. Normal reflexes, muscle tone coordination. No cranial nerve deficit noted. PSYCHIATRIC: Normal mood and affect. Normal behavior. Normal judgment and thought content. CARDIOVASCULAR: Normal heart rate noted, regular rhythm RESPIRATORY: Effort and breath sounds normal, no problems with respiration noted ABDOMEN: Soft, nontender, nondistended, gravid. MUSCULOSKELETAL: Normal range of motion. No edema and no tenderness. 2+ distal pulses.  Cervical Exam: Dilatation 3.5cm   Effacement 50%   Station -2   Presentation: cephalic FHT:  Baseline rate 140 bpm   Variability moderate  Accelerations present   Decelerations early Contractions: Every 3 mins   Assessment : Penny Leonard is a 21 y.o. G2P1001 at [redacted]w[redacted]d being admitted for induction of labor due to [facility] and term pregnancy.  Plan: Labor:  Induction/Augmentation as ordered as per protocol. Analgesia as needed. FWB: Reassuring fetal heart tracing.  GBS positive- PCN to treat Delivery plan: Hopeful for vaginal delivery #MOF: breast #MOC: undecided  Dannielle Huh, DO

## 2016-12-21 ENCOUNTER — Encounter (HOSPITAL_COMMUNITY): Payer: Self-pay

## 2016-12-21 ENCOUNTER — Encounter: Payer: Medicaid Other | Admitting: Certified Nurse Midwife

## 2016-12-21 DIAGNOSIS — Z3A39 39 weeks gestation of pregnancy: Secondary | ICD-10-CM

## 2016-12-21 LAB — COMPREHENSIVE METABOLIC PANEL
ALBUMIN: 2.3 g/dL — AB (ref 3.5–5.0)
ALK PHOS: 199 U/L — AB (ref 38–126)
ALT: 10 U/L — AB (ref 14–54)
ANION GAP: 9 (ref 5–15)
AST: 33 U/L (ref 15–41)
BILIRUBIN TOTAL: 0.7 mg/dL (ref 0.3–1.2)
BUN: 5 mg/dL — AB (ref 6–20)
CALCIUM: 8.6 mg/dL — AB (ref 8.9–10.3)
CO2: 19 mmol/L — AB (ref 22–32)
CREATININE: 0.8 mg/dL (ref 0.44–1.00)
Chloride: 106 mmol/L (ref 101–111)
GFR calc Af Amer: >60 mL/min (ref >60)
GFR calc non Af Amer: >60 mL/min (ref >60)
GLUCOSE: 140 mg/dL — AB (ref 65–99)
Potassium: 3.8 mmol/L (ref 3.5–5.1)
SODIUM: 134 mmol/L — AB (ref 135–145)
TOTAL PROTEIN: 5.2 g/dL — AB (ref 6.5–8.1)

## 2016-12-21 LAB — PROTEIN / CREATININE RATIO, URINE
Creatinine, Urine: 89 mg/dL
PROTEIN CREATININE RATIO: 0.2 mg/mg{creat} — AB (ref 0.00–0.15)
Total Protein, Urine: 18 mg/dL

## 2016-12-21 LAB — RPR: RPR Ser Ql: NONREACTIVE

## 2016-12-21 MED ORDER — IBUPROFEN 600 MG PO TABS
600.0000 mg | ORAL_TABLET | Freq: Four times a day (QID) | ORAL | Status: DC
  Administered 2016-12-21 – 2016-12-22 (×7): 600 mg via ORAL
  Filled 2016-12-21 (×7): qty 1

## 2016-12-21 MED ORDER — TERBUTALINE SULFATE 1 MG/ML IJ SOLN
INTRAMUSCULAR | Status: AC
  Filled 2016-12-21: qty 1

## 2016-12-21 MED ORDER — COCONUT OIL OIL: 1.0000 "application " | TOPICAL_OIL | Status: DC | PRN

## 2016-12-21 MED ORDER — SIMETHICONE 80 MG PO CHEW: 80.0000 mg | CHEWABLE_TABLET | ORAL | Status: DC | PRN

## 2016-12-21 MED ORDER — WITCH HAZEL-GLYCERIN EX PADS: 1.0000 "application " | MEDICATED_PAD | CUTANEOUS | Status: DC | PRN

## 2016-12-21 MED ORDER — ONDANSETRON HCL 4 MG PO TABS: 4.0000 mg | ORAL_TABLET | ORAL | Status: DC | PRN

## 2016-12-21 MED ORDER — ZOLPIDEM TARTRATE 5 MG PO TABS: 5.0000 mg | ORAL_TABLET | Freq: Every evening | ORAL | Status: DC | PRN

## 2016-12-21 MED ORDER — PRENATAL MULTIVITAMIN CH
1.0000 | ORAL_TABLET | Freq: Every day | ORAL | Status: DC
  Administered 2016-12-21 – 2016-12-22 (×2): 1 via ORAL
  Filled 2016-12-21 (×2): qty 1

## 2016-12-21 MED ORDER — TETANUS-DIPHTH-ACELL PERTUSSIS 5-2.5-18.5 LF-MCG/0.5 IM SUSP: 0.5000 mL | Freq: Once | INTRAMUSCULAR | Status: DC

## 2016-12-21 MED ORDER — ONDANSETRON HCL 4 MG/2ML IJ SOLN: 4.0000 mg | INTRAMUSCULAR | Status: DC | PRN

## 2016-12-21 MED ORDER — BENZOCAINE-MENTHOL 20-0.5 % EX AERO
1.0000 "application " | INHALATION_SPRAY | CUTANEOUS | Status: DC | PRN
  Administered 2016-12-21: 1 via TOPICAL
  Filled 2016-12-21: qty 56

## 2016-12-21 MED ORDER — ACETAMINOPHEN 325 MG PO TABS
650.0000 mg | ORAL_TABLET | ORAL | Status: DC | PRN
  Administered 2016-12-21 – 2016-12-22 (×3): 650 mg via ORAL
  Filled 2016-12-21 (×3): qty 2

## 2016-12-21 MED ORDER — DIBUCAINE 1 % RE OINT: 1.0000 "application " | TOPICAL_OINTMENT | RECTAL | Status: DC | PRN

## 2016-12-21 MED ORDER — SENNOSIDES-DOCUSATE SODIUM 8.6-50 MG PO TABS
2.0000 | ORAL_TABLET | ORAL | Status: DC
  Administered 2016-12-21: 2 via ORAL
  Filled 2016-12-21: qty 2

## 2016-12-21 MED ORDER — DIPHENHYDRAMINE HCL 25 MG PO CAPS: 25.0000 mg | ORAL_CAPSULE | Freq: Four times a day (QID) | ORAL | Status: DC | PRN

## 2016-12-21 MED ORDER — OXYTOCIN 40 UNITS IN LACTATED RINGERS INFUSION - SIMPLE MED
1.0000 m[IU]/min | INTRAVENOUS | Status: DC
  Administered 2016-12-21: 6 m[IU]/min via INTRAVENOUS
  Administered 2016-12-21: 1 m[IU]/min via INTRAVENOUS

## 2016-12-21 NOTE — Progress Notes (Addendum)
Patient ID: Penny Leonard, female   DOB: Aug 19, 1995, 21 y.o.   MRN: [id number]  Prolonged decel - category 1 tracing before. Relieved by fluid bolus, O2, trendelenburg, d/c pitocin. Will rest and recover baby, then retry pitocin. Discussed possibility of cesarean delivery with patient.   Cervical exam: 8/90/-1  Truett Mainland, DO

## 2016-12-21 NOTE — Progress Notes (Signed)
Patient ID: Penny Leonard, female   DOB: 17-Nov-1995, 21 y.o.   MRN: [id number] Penny Leonard is a 21 y.o. G2P1001 at [redacted]w[redacted]d admitted for induction of labor due to suspected LGA.  Subjective: Comfortable, no complaints  Objective: BP (!) 117/92   Pulse 92   Temp 98.8 F (37.1 C) (Axillary)   Resp 18   Ht 5\' 6"  (1.676 m)   Wt 105.7 kg (233 lb)   LMP 03/17/2016 (Approximate)   SpO2 100%   BMI 37.61 kg/m  Total I/O In: 475 [Other:475] Out: -   FHT:  FHR: 140 bpm, variability: moderate,  accelerations:  Present,  decelerations:  Present occ variable UC:   q 1-45mins  SVE:   Dilation: 8 Effacement (%): 90, 80 Station: 0 Exam by:: Gailen Shelter RN   Pitocin @ 3 mu/min  Labs: Lab Results  Component Value Date   WBC 8.9 12/20/2016   HGB 9.8 (L) 12/20/2016   HCT 30.9 (L) 12/20/2016   MCV 81.5 12/20/2016   PLT 207 12/20/2016    Assessment / Plan: IOL d/t suspected LGA, slowly resuming pitocin, MVUs inadequate  Labor: inadequate Fetal Wellbeing:  Category II Pain Control:  Epidural Pre-eclampsia: n/a I/D:  pcn for gbs+ Anticipated MOD:  dependent upon FHR w/ resuming pitocin  Tawnya Crook CNM, WHNP-BC 12/21/2016, 3:08 AM

## 2016-12-21 NOTE — Plan of Care (Signed)
Progressing well for discharge

## 2016-12-21 NOTE — Lactation Note (Signed)
This note was copied from a baby's chart. Lactation Consultation Note  Patient Name: Penny Leonard [id number] Date: 12/21/2016 Reason for consult: Initial assessment;Primapara;Term Breastfeeding consultation services and support information given and reviewed. This is mom's first baby and newborn is 46 hours old.  Baby has been to breast 3 times and mom reports easy latches.  Instructed on hand expression.  Discussed watching baby for feeding cues and to call for assist prn.  Maternal Data Has patient been taught Hand Expression?: Yes Does the patient have breastfeeding experience prior to this delivery?: No  Feeding Feeding Type: Breast Fed Length of feed: 30 min  LATCH Score Latch: Repeated attempts needed to sustain latch, nipple held in mouth throughout feeding, stimulation needed to elicit sucking reflex.  Audible Swallowing: A few with stimulation  Type of Nipple: Everted at rest and after stimulation  Comfort (Breast/Nipple): Soft / non-tender  Hold (Positioning): Assistance needed to correctly position infant at breast and maintain latch.  LATCH Score: 7  Interventions    Lactation Tools Discussed/Used     Consult Status Consult Status: Follow-up Date: 12/22/16 Follow-up type: In-patient    Ave Filter 12/21/2016, 9:43 AM

## 2016-12-21 NOTE — Anesthesia Postprocedure Evaluation (Signed)
Anesthesia Post Note  Patient: Penny Leonard  Procedure(s) Performed: AN AD HOC LABOR EPIDURAL     Patient location during evaluation: Mother Baby Anesthesia Type: Epidural Level of consciousness: awake and alert and oriented Pain management: pain level controlled Vital Signs Assessment: post-procedure vital signs reviewed and stable Respiratory status: spontaneous breathing and nonlabored ventilation Cardiovascular status: stable Postop Assessment: no headache, patient able to bend at knees, no backache, no apparent nausea or vomiting, epidural receding and adequate PO intake Anesthetic complications: no    Last Vitals:  Vitals:   12/21/16 0830 12/21/16 1208  BP: 117/66 116/70  Pulse: 100 96  Resp: 18 16  Temp: 36.9 C 36.7 C  SpO2: 100% 100%    Last Pain:  Vitals:   12/21/16 1208  TempSrc: Oral  PainSc: 4    Pain Goal: Patients Stated Pain Goal: 3 (12/21/16 1208)               Jabier Mutton

## 2016-12-22 LAB — BIRTH TISSUE RECOVERY COLLECTION (PLACENTA DONATION)

## 2016-12-22 MED ORDER — IBUPROFEN 600 MG PO TABS: 600.0000 mg | ORAL_TABLET | Freq: Four times a day (QID) | ORAL | 1 refills | Status: AC | PRN

## 2016-12-22 MED ORDER — SENNOSIDES-DOCUSATE SODIUM 8.6-50 MG PO TABS: 2.0000 | ORAL_TABLET | Freq: Every evening | ORAL | 0 refills | Status: AC | PRN

## 2016-12-22 NOTE — Lactation Note (Signed)
This note was copied from a baby's chart. Lactation Consultation Note  Patient Name: Penny Leonard [id number] Date: 12/22/2016   Randel Books is now 13 hours old.  Mom reports baby is feeding well.  Discussed cluster feeding.  Denies questions or concerns.  Encouraged to call with concerns/latch assist.  Maternal Data    Feeding    LATCH Score                   Interventions    Lactation Tools Discussed/Used     Consult Status      Ave Filter 12/22/2016, 3:35 PM

## 2016-12-22 NOTE — Clinical Social Work Maternal (Signed)
CLINICAL SOCIAL WORK MATERNAL/CHILD NOTE  Patient Details  Name: Penny Leonard MRN: [id number] Date of Birth: 11/23/1995  Date:  12/22/2016  Clinical Social Worker Initiating Note:  Zenobia Kuennen Boyd-Gilyard Date/Time: Initiated:  12/22/16/1136     Child's Name:  Jahmir Miller   Biological Parents:  Mother, Father   Need for Interpreter:  None   Reason for Referral:  Current Substance Use/Substance Use During Pregnancy    Address:  [address] [place] [address]    Phone number:  [phone] (home)     Additional phone number: MOB's sister, D'Anria Burgandt ([phone]).  Household Members/Support Persons (HM/SP):   Household Member/Support Person 1   HM/SP Name Relationship DOB or Age  HM/SP -1 A'Neviah Dotson(MOB also lives with MOB's sister and a roommate.) daughter 07/18/2014  HM/SP -2        HM/SP -3        HM/SP -4        HM/SP -5        HM/SP -6        HM/SP -7        HM/SP -8          Natural Supports (not living in the home):  Parent, Immediate Family   Professional Supports: (CSW provided MOB with contact information for outpatinet therapy. )   Employment: Unemployed   Type of Work:     Education:  High school graduate   Homebound arranged:    Financial Resources:  Medicaid   Other Resources:  Food Stamps , WIC   Cultural/Religious Considerations Which May Impact Care:  Per MOB's Face Sheet, MOB is [employer].  Strengths:  Ability to meet basic needs , Home prepared for child    Psychotropic Medications:         Pediatrician:       Pediatrician List:   [place]    [place]    [place]    [place]    [place]    [place]      Pediatrician Fax Number:    Risk Factors/Current Problems:  Substance Use    Cognitive State:  Linear Thinking , Insightful    Mood/Affect:  Happy , Calm , Relaxed , Interested , Comfortable    CSW Assessment: CSW met with MOB to complete an assessment  for positive UDS for infant for THC. When CSW arrived, MOB was bonding with infant with infant as evidence by engaging in breastfeeding. CSW explained CSW's role and encouraged MOB to ask questions.   CSW asked about MOB's supports and MOB communicated that MOB and family will be supported by FOB, MOB's mom, and FOB's immediate family.    CSW provided PPD education and MOB acknowledged PPD with MOB's oldest child.  MOB reported daily feelings of sadness that last for almost 1 year.  MOB stated that MOB did not inform MOB's providers or family about MOB's thoughts and feelings and managed symptoms with daily prayer. CSW provided education regarding Baby Blues vs PMADs and provided MOB with information about support groups held at [facility].  CSW encouraged MOB to evaluate her mental health throughout the postpartum period with the use of the New Mom Checklist developed by Postpartum Progress and notify a medical professional if symptoms arise.  CSW assessed for safety and MOB denied SI and HI.  MOB was receptive to resource information for outpatient counseling and agreed to make contact if needed.   CSW also asked about MOB's SA   hx and MOB openly shared MOB's use of marijuana during pregnancy. MOB reports MOB's last use was October 2018.  MOB reported that MOB used marijuana to help increase MOB's appetite. CSW shared the hospital's SA policy and screenings.  CSW was informed that infant's UDS was positive for THC and CSW will continue to monitor infant's CDS.  CSW also made MOB aware that CSW will make a report to [place] CPS for infant's positive UDS.  MOB was understanding and expressed that MOB was familiar with CPS investigation from a previous report from the hospital for MOB's oldest daughter's  positive screen for THC around 07/20/2014.   MOB reports having all necessary items for infant and feeling prepared to parent.  CSW provided MOB with information to add infant to MOB's WIC and  Food Stamp applications. SIDS education was also provided.   CSW made [place] CPS report with CPS intake worker, Pam Miller.   CSW Plan/Description:  Perinatal Mood and Anxiety Disorder (PMADs) Education, Hospital Drug Screen Policy Information, CSW Will Continue to Monitor Umbilical Cord Tissue Drug Screen Results and Make Report if Warranted, Child Protective Service Report , Other Information/Referral to Community Resources, Sudden Infant Death Syndrome (SIDS) Education, No Further Intervention Required/No Barriers to Discharge   Floella Ensz Boyd-Gilyard, MSW, LCSW Clinical Social Work [phone]  Larence Thone D BOYD-GILYARD, LCSW 12/22/2016, 11:51 AM 

## 2016-12-22 NOTE — Discharge Summary (Signed)
OB Discharge Summary     Patient Name: Penny Leonard DOB: 01-Mar-1995 MRN: [id number]  Date of admission: 12/20/2016 Delivering MD: Wells Guiles R   Date of discharge: 12/22/2016  Admitting diagnosis: INDUCTION Intrauterine pregnancy: [redacted]w[redacted]d     Secondary diagnosis:  Active Problems:   SVD (spontaneous vaginal delivery)   LGA (large for gestational age) fetus affecting management of mother  Additional problems: varicella non-immune, GBS positive     Discharge diagnosis: Term Pregnancy Delivered                                                                                                Post partum procedures:none  Augmentation: Cytotec, Pitocin  Complications: None  Hospital course:  Induction of Labor With Vaginal Delivery   21 y.o. yo [id number] at [redacted]w[redacted]d was admitted to the hospital 12/20/2016 for induction of labor.  Indication for induction: due to LGA.  Patient had an uncomplicated labor course as follows: Membrane Rupture Time/Date: 2:55 PM ,12/20/2016   Intrapartum Procedures: Episiotomy: None [1]                                         Lacerations:  None [1]  Patient had delivery of a Viable infant.  Information for the patient's newborn:  Jadamarie, Butson [[id number]]  Delivery Method: Vaginal, Spontaneous(Filed from Delivery Summary)   12/21/2016  Details of delivery can be found in separate delivery note.  Patient had a routine postpartum course. Patient is discharged home 12/22/16.  Physical exam  Vitals:   12/21/16 0830 12/21/16 1208 12/21/16 2057 12/22/16 0518  BP: 117/66 116/70 121/67 112/68  Pulse: 100 96 78 73  Resp: 18 16 18 18   Temp: 98.4 F (36.9 C) 98.1 F (36.7 C) 98.6 F (37 C) 98.2 F (36.8 C)  TempSrc: Oral Oral Oral Oral  SpO2: 100% 100% 98%   Weight:      Height:       General: alert, cooperative and no distress Lochia: appropriate Uterine Fundus: firm Incision: N/A DVT Evaluation: No evidence of DVT seen on physical exam. No  cords or calf tenderness. Labs: Lab Results  Component Value Date   WBC 8.9 12/20/2016   HGB 9.8 (L) 12/20/2016   HCT 30.9 (L) 12/20/2016   MCV 81.5 12/20/2016   PLT 207 12/20/2016   CMP Latest Ref Rng & Units 12/21/2016  Glucose 65 - 99 mg/dL 140(H)  BUN 6 - 20 mg/dL 5(L)  Creatinine 0.44 - 1.00 mg/dL 0.80  Sodium 135 - 145 mmol/L 134(L)  Potassium 3.5 - 5.1 mmol/L 3.8  Chloride 101 - 111 mmol/L 106  CO2 22 - 32 mmol/L 19(L)  Calcium 8.9 - 10.3 mg/dL 8.6(L)  Total Protein 6.5 - 8.1 g/dL 5.2(L)  Total Bilirubin 0.3 - 1.2 mg/dL 0.7  Alkaline Phos 38 - 126 U/L 199(H)  AST 15 - 41 U/L 33  ALT 14 - 54 U/L 10(L)    Discharge instruction: per After Visit Summary and "Baby and Me Booklet".  After visit meds:  Allergies as of 12/22/2016   No Known Allergies     Medication List    TAKE these medications   CITRANATAL BLOOM 90-1 MG Tabs Take 1 tablet by mouth daily.   ibuprofen 600 MG tablet Commonly known as:  ADVIL,MOTRIN Take 1 tablet (600 mg total) every 6 (six) hours as needed by mouth.   senna-docusate 8.6-50 MG tablet Commonly known as:  Senokot-S Take 2 tablets at bedtime as needed by mouth for mild constipation.       Diet: routine diet  Activity: Advance as tolerated. Pelvic rest for 6 weeks.   Outpatient follow up:4 weeks Follow up Appt: Future Appointments  Date Time Provider [place]  01/18/2017 10:00 AM Kandis Cocking A, CNM [facility] None   Follow up Visit:No Follow-up on file.  Postpartum contraception: Condoms  Newborn Data: Live born female  Birth Weight: 9 lb 14.9 oz (4505 g) APGAR: 8, 9  Newborn Delivery   Birth date/time:  12/21/2016 04:46:00 Delivery type:  Vaginal, Spontaneous     Baby Feeding: Breast Disposition:home with mother   12/22/2016 Nuala Alpha, DO  I confirm that I have verified the information documented in the resident's note and that I have also personally reperformed the physical exam and all medical  decision making activities.  Patient was seen and examined by me also Agree with note Vitals stable Labs stable Fundus firm, lochia within normal limits Perineum healing Ext WNL Continue care Ready for discharge  Seabron Spates, CNM

## 2016-12-22 NOTE — Discharge Instructions (Signed)
Postpartum Care After Vaginal Delivery °The period of time right after you deliver your newborn is called the postpartum period. °What kind of medical care will I receive? °· You may continue to receive fluids and medicines through an IV tube inserted into one of your veins. °· If an incision was made near your vagina (episiotomy) or if you had some vaginal tearing during delivery, cold compresses may be placed on your episiotomy or your tear. This helps to reduce pain and swelling. °· You may be given a squirt bottle to use when you go to the bathroom. You may use this until you are comfortable wiping as usual. To use the squirt bottle, follow these steps: °? Before you urinate, fill the squirt bottle with warm water. Do not use hot water. °? After you urinate, while you are sitting on the toilet, use the squirt bottle to rinse the area around your urethra and vaginal opening. This rinses away any urine and blood. °? You may do this instead of wiping. As you start healing, you may use the squirt bottle before wiping yourself. Make sure to wipe gently. °? Fill the squirt bottle with clean water every time you use the bathroom. °· You will be given sanitary pads to wear. °How can I expect to feel? °· You may not feel the need to urinate for several hours after delivery. °· You will have some soreness and pain in your abdomen and vagina. °· If you are breastfeeding, you may have uterine contractions every time you breastfeed for up to several weeks postpartum. Uterine contractions help your uterus return to its normal size. °· It is normal to have vaginal bleeding (lochia) after delivery. The amount and appearance of lochia is often similar to a menstrual period in the first week after delivery. It will gradually decrease over the next few weeks to a dry, yellow-brown discharge. For most women, lochia stops completely by 6-8 weeks after delivery. Vaginal bleeding can vary from woman to woman. °· Within the first few  days after delivery, you may have breast engorgement. This is when your breasts feel heavy, full, and uncomfortable. Your breasts may also throb and feel hard, tightly stretched, warm, and tender. After this occurs, you may have milk leaking from your breasts. Your health care provider can help you relieve discomfort due to breast engorgement. Breast engorgement should go away within a few days. °· You may feel more sad or worried than normal due to hormonal changes after delivery. These feelings should not last more than a few days. If these feelings do not go away after several days, speak with your health care provider. °How should I care for myself? °· Tell your health care provider if you have pain or discomfort. °· Drink enough water to keep your urine clear or pale yellow. °· Wash your hands thoroughly with soap and water for at least 20 seconds after changing your sanitary pads, after using the toilet, and before holding or feeding your baby. °· If you are not breastfeeding, avoid touching your breasts a lot. Doing this can make your breasts produce more milk. °· If you become weak or lightheaded, or you feel like you might faint, ask for help before: °? Getting out of bed. °? Showering. °· Change your sanitary pads frequently. Watch for any changes in your flow, such as a sudden increase in volume, a change in color, the passing of large blood clots. If you pass a blood clot from your vagina, save it   to show to your health care provider. Do not flush blood clots down the toilet without having your health care provider look at them. °· Make sure that all your vaccinations are up to date. This can help protect you and your baby from getting certain diseases. You may need to have immunizations done before you leave the hospital. °· If desired, talk with your health care provider about methods of family planning or birth control (contraception). °How can I start bonding with my baby? °Spending as much time as  possible with your baby is very important. During this time, you and your baby can get to know each other and develop a bond. Having your baby stay with you in your room (rooming in) can give you time to get to know your baby. Rooming in can also help you become comfortable caring for your baby. Breastfeeding can also help you bond with your baby. °How can I plan for returning home with my baby? °· Make sure that you have a car seat installed in your vehicle. °? Your car seat should be checked by a certified car seat installer to make sure that it is installed safely. °? Make sure that your baby fits into the car seat safely. °· Ask your health care provider any questions you have about caring for yourself or your baby. Make sure that you are able to contact your health care provider with any questions after leaving the hospital. °This information is not intended to replace advice given to you by your health care provider. Make sure you discuss any questions you have with your health care provider. °Document Released: 11/28/2006 Document Revised: 07/06/2015 Document Reviewed: 01/05/2015 °Elsevier Interactive Patient Education © 2018 [employer] ° °

## 2016-12-26 ENCOUNTER — Telehealth (HOSPITAL_COMMUNITY): Payer: Self-pay | Admitting: *Deleted

## 2016-12-29 ENCOUNTER — Inpatient Hospital Stay (HOSPITAL_COMMUNITY): Payer: Medicaid Other

## 2017-01-02 ENCOUNTER — Other Ambulatory Visit: Payer: Self-pay | Admitting: Certified Nurse Midwife

## 2017-01-18 ENCOUNTER — Ambulatory Visit: Payer: Medicaid Other | Admitting: Certified Nurse Midwife

## 2018-07-07 ENCOUNTER — Emergency Department (HOSPITAL_COMMUNITY)
Admission: EM | Admit: 2018-07-07 | Discharge: 2018-07-07 | Disposition: A | Discharge status: Home / Self Care | Payer: Self-pay | Attending: Emergency Medicine | Admitting: Emergency Medicine

## 2018-07-07 ENCOUNTER — Other Ambulatory Visit: Payer: Self-pay

## 2018-07-07 ENCOUNTER — Encounter (HOSPITAL_COMMUNITY): Payer: Self-pay | Admitting: Emergency Medicine

## 2018-07-07 ENCOUNTER — Emergency Department (HOSPITAL_COMMUNITY): Payer: Self-pay

## 2018-07-07 DIAGNOSIS — S21051A Open bite of right breast, initial encounter: Secondary | ICD-10-CM | POA: Insufficient documentation

## 2018-07-07 DIAGNOSIS — Y939 Activity, unspecified: Secondary | ICD-10-CM | POA: Insufficient documentation

## 2018-07-07 DIAGNOSIS — S41112A Laceration without foreign body of left upper arm, initial encounter: Secondary | ICD-10-CM | POA: Insufficient documentation

## 2018-07-07 DIAGNOSIS — Z23 Encounter for immunization: Secondary | ICD-10-CM | POA: Insufficient documentation

## 2018-07-07 DIAGNOSIS — Y999 Unspecified external cause status: Secondary | ICD-10-CM | POA: Insufficient documentation

## 2018-07-07 DIAGNOSIS — Y929 Unspecified place or not applicable: Secondary | ICD-10-CM | POA: Insufficient documentation

## 2018-07-07 DIAGNOSIS — F17200 Nicotine dependence, unspecified, uncomplicated: Secondary | ICD-10-CM | POA: Insufficient documentation

## 2018-07-07 DIAGNOSIS — T148XXA Other injury of unspecified body region, initial encounter: Secondary | ICD-10-CM

## 2018-07-07 DIAGNOSIS — S61412A Laceration without foreign body of left hand, initial encounter: Secondary | ICD-10-CM | POA: Insufficient documentation

## 2018-07-07 MED ORDER — LIDOCAINE-EPINEPHRINE (PF) 2 %-1:200000 IJ SOLN
20.0000 mL | Freq: Once | INTRAMUSCULAR | Status: AC
  Administered 2018-07-07: 20 mL
  Filled 2018-07-07: qty 20

## 2018-07-07 MED ORDER — AMOXICILLIN-POT CLAVULANATE 500-125 MG PO TABS: 1.0000 | ORAL_TABLET | Freq: Three times a day (TID) | ORAL | 0 refills | Status: AC

## 2018-07-07 MED ORDER — TETANUS-DIPHTH-ACELL PERTUSSIS 5-2.5-18.5 LF-MCG/0.5 IM SUSP
0.5000 mL | Freq: Once | INTRAMUSCULAR | Status: AC
  Administered 2018-07-07: 0.5 mL via INTRAMUSCULAR
  Filled 2018-07-07: qty 0.5

## 2018-07-07 NOTE — ED Notes (Signed)
Patient transported to X-ray 

## 2018-07-07 NOTE — ED Notes (Signed)
Suture cart at patient's bedside.
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
## 2018-07-07 NOTE — ED Notes (Signed)
Pt back from x-ray.
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
## 2018-07-17 ENCOUNTER — Other Ambulatory Visit: Payer: Self-pay

## 2018-07-17 ENCOUNTER — Emergency Department (HOSPITAL_COMMUNITY)
Admission: EM | Admit: 2018-07-17 | Discharge: 2018-07-17 | Disposition: A | Discharge status: Home / Self Care | Payer: Self-pay | Attending: Emergency Medicine | Admitting: Emergency Medicine

## 2018-07-17 DIAGNOSIS — L089 Local infection of the skin and subcutaneous tissue, unspecified: Secondary | ICD-10-CM | POA: Insufficient documentation

## 2018-07-17 DIAGNOSIS — T148XXA Other injury of unspecified body region, initial encounter: Secondary | ICD-10-CM

## 2018-07-17 DIAGNOSIS — Z4802 Encounter for removal of sutures: Secondary | ICD-10-CM | POA: Insufficient documentation

## 2018-07-17 MED ORDER — DOXYCYCLINE HYCLATE 100 MG PO CAPS: 100.0000 mg | ORAL_CAPSULE | Freq: Two times a day (BID) | ORAL | 0 refills | Status: AC

## 2018-07-17 NOTE — Discharge Instructions (Addendum)

## 2018-07-17 NOTE — ED Triage Notes (Signed)
Rt.forearm stitches to be removed.
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
## 2018-07-22 LAB — AEROBIC/ANAEROBIC CULTURE W GRAM STAIN (SURGICAL/DEEP WOUND)

## 2018-07-23 ENCOUNTER — Telehealth: Payer: Self-pay

## 2018-07-23 NOTE — Telephone Encounter (Signed)
Post ED Visit - Positive Culture Follow-up  Culture report reviewed by antimicrobial stewardship pharmacist: [facility] Team []  Elenor Quinones, Pharm.D. []  Heide Guile, Pharm.D., BCPS AQ-ID []  Parks Neptune, Pharm.D., BCPS []  Alycia Rossetti, Pharm.D., BCPS []  [place], Pharm.D., BCPS, AAHIVP []  Legrand Como, Pharm.D., BCPS, AAHIVP []  Salome Arnt, PharmD, BCPS []  Johnnette Gourd, PharmD, BCPS []  Hughes Better, PharmD, BCPS []  Leeroy Cha, PharmD []  Laqueta Linden, PharmD, BCPS []  Albertina Parr, PharmD [facility] Team []  Leodis Sias, PharmD []  Lindell Spar, PharmD []  Royetta Asal, PharmD []  Graylin Shiver, Rph []  Rema Fendt) Glennon Mac, PharmD []  Arlyn Dunning, PharmD []  Netta Cedars, PharmD []  Dia Sitter, PharmD []  Leone Haven, PharmD []  Gretta Arab, PharmD []  Theodis Shove, PharmD []  Peggyann Juba, PharmD []  Reuel Boom, PharmD   Positive aerobic culture Treated with Augmentin, organism sensitive to the same and no further patient follow-up is required at this time.  Genia Del 07/23/2018, 9:12 AM

## 2018-07-30 ENCOUNTER — Emergency Department (HOSPITAL_COMMUNITY)
Admission: EM | Admit: 2018-07-30 | Discharge: 2018-07-30 | Disposition: A | Discharge status: Home / Self Care | Payer: Self-pay | Attending: Emergency Medicine | Admitting: Emergency Medicine

## 2018-07-30 ENCOUNTER — Encounter (HOSPITAL_COMMUNITY): Payer: Self-pay

## 2018-07-30 ENCOUNTER — Other Ambulatory Visit: Payer: Self-pay

## 2018-07-30 DIAGNOSIS — X58XXXD Exposure to other specified factors, subsequent encounter: Secondary | ICD-10-CM | POA: Insufficient documentation

## 2018-07-30 DIAGNOSIS — S61412D Laceration without foreign body of left hand, subsequent encounter: Secondary | ICD-10-CM | POA: Insufficient documentation

## 2018-07-30 DIAGNOSIS — F17200 Nicotine dependence, unspecified, uncomplicated: Secondary | ICD-10-CM | POA: Insufficient documentation

## 2018-07-30 DIAGNOSIS — S51812D Laceration without foreign body of left forearm, subsequent encounter: Secondary | ICD-10-CM | POA: Insufficient documentation

## 2018-07-30 DIAGNOSIS — Z79899 Other long term (current) drug therapy: Secondary | ICD-10-CM | POA: Insufficient documentation

## 2018-07-30 DIAGNOSIS — Z4802 Encounter for removal of sutures: Secondary | ICD-10-CM | POA: Insufficient documentation

## 2018-07-30 NOTE — ED Triage Notes (Signed)
Pt had sutures placed on may 23rd. Pt is here for suture removal

## 2018-07-30 NOTE — ED Notes (Signed)
Suture removal kit at bedside. 

## 2018-07-30 NOTE — ED Provider Notes (Signed)
[facility] EMERGENCY DEPARTMENT Provider Note   CSN: [id number] Arrival date & time: 07/30/18  1823    History   Chief Complaint Chief Complaint  Patient presents with  . Suture / Staple Removal    HPI Penny Leonard is a 23 y.o. female presents today for evaluation of wound to the left upper extremity and suture removal.  Seen and evaluated on 07/07/2018 for these wounds and return on 07/17/2018 with concern for possible infection.  Partial suture removal at that time and was discharged with doxycycline which she states she has been taking.  She reports no drainage from the wounds, no fevers.  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  Denies numbness or tingling.  Has no other complaints.     The history is provided by the patient.    History reviewed. No pertinent past medical history.  Patient Active Problem List   Diagnosis Date Noted  . LGA (large for gestational age) fetus affecting management of mother 12/20/2016  . Positive GBS test 11/28/2016  . Anemia during pregnancy in third trimester 10/03/2016  . Maternal varicella, non-immune 08/03/2016  . Supervision of other normal pregnancy, antepartum 08/01/2016  . SVD (spontaneous vaginal delivery) 07/18/2014    Past Surgical History:  Procedure Laterality Date  . WRIST SURGERY       OB History    Gravida  2   Para  2   Term  2   Preterm      AB      Living  2     SAB      TAB      Ectopic      Multiple  0   Live Births  2            Home Medications    Prior to Admission medications   Medication Sig Start Date End Date Taking? Authorizing Provider  doxycycline (VIBRAMYCIN) 100 MG capsule Take 1 capsule (100 mg total) by mouth 2 (two) times daily. 07/17/18   Lorin Glass, PA-C  ibuprofen (ADVIL,MOTRIN) 600 MG tablet Take 1 tablet (600 mg total) every 6 (six) hours as needed  by mouth. Patient not taking: Reported on 07/07/2018 12/22/16   Nuala Alpha, DO  Prenatal-DSS-FeCb-FeGl-FA (CITRANATAL BLOOM) 90-1 MG TABS Take 1 tablet by mouth daily. Patient not taking: Reported on 07/07/2018 10/03/16   Kandis Cocking A, CNM  senna-docusate (SENOKOT-S) 8.6-50 MG tablet Take 2 tablets at bedtime as needed by mouth for mild constipation. Patient not taking: Reported on 07/07/2018 12/22/16   Nuala Alpha, DO    Family History Family History  Problem Relation Age of Onset  . Diabetes Father     Social History Social History   Tobacco Use  . Smoking status: Light Tobacco Smoker  . Smokeless tobacco: Never Used  Substance Use Topics  . Alcohol use: No    Comment: occassional when not pregnant   . Drug use: Yes    Types: Marijuana    Comment: last used 2 weeks ago     Allergies   Patient has no known allergies.   Review of Systems Review of Systems  Constitutional: Negative for fever.  Skin: Positive for wound.  Neurological: Negative for weakness.     Physical Exam Updated Vital Signs BP 118/73   Pulse 65   Temp 98 F (36.7 C) (Oral)   Ht 5'  6" (1.676 m)   Wt 83.9 kg   LMP 07/07/2018   BMI 29.86 kg/m   Physical Exam Vitals signs and nursing note reviewed.  Constitutional:      General: She is not in acute distress.    Appearance: She is well-developed.  HENT:     Head: Normocephalic and atraumatic.  Eyes:     General:        Right eye: No discharge.        Left eye: No discharge.     Conjunctiva/sclera: Conjunctivae normal.  Neck:     Vascular: No JVD.     Trachea: No tracheal deviation.  Cardiovascular:     Rate and Rhythm: Normal rate.     Pulses: Normal pulses.     Comments: 2+ radial pulses bilaterally Pulmonary:     Effort: Pulmonary effort is normal.  Abdominal:     General: There is no distension.  Musculoskeletal:     Comments: See below images.  Patient with 4 cm healing wound to the left forearm and 3 cm healing  wound to the left hand.  No drainage or dehiscence noted.  No surrounding erythema or induration.  No tenderness to palpation.  Moving extremities spontaneously with intact strength, good grip strength bilaterally.  Skin:    General: Skin is warm and dry.     Findings: No erythema.  Neurological:     Mental Status: She is alert.     Comments: Sensation intact to soft touch of bilateral upper extremities.  Psychiatric:        Behavior: Behavior normal.          ED Treatments / Results  Labs (all labs ordered are listed, but only abnormal results are displayed) Labs Reviewed - No data to display  EKG None  Radiology No results found.  Procedures .Suture Removal  Date/Time: 07/30/2018 7:41 PM Performed by: Renita Papa, PA-C Authorized by: Renita Papa, PA-C   Consent:    Consent obtained:  Verbal   Consent given by:  Patient   Risks discussed:  Bleeding, pain and wound separation   Alternatives discussed:  No treatment and delayed treatment Location:    Location:  Upper extremity   Upper extremity location:  Arm   Arm location:  L lower arm Procedure details:    Wound appearance:  No signs of infection, good wound healing, clean, nonpurulent and nontender   Number of sutures removed:  3 Post-procedure details:    Post-removal:  No dressing applied   Patient tolerance of procedure:  Tolerated well, no immediate complications .Suture Removal  Date/Time: 07/30/2018 7:42 PM Performed by: Renita Papa, PA-C Authorized by: Renita Papa, PA-C   Consent:    Consent obtained:  Verbal   Consent given by:  Patient   Risks discussed:  Bleeding, pain and wound separation   Alternatives discussed:  No treatment and delayed treatment Location:    Location:  Upper extremity   Upper extremity location:  Hand   Hand location:  L hand Procedure details:    Wound appearance:  No signs of infection, good wound healing, clean, nonpurulent and nontender   Number of sutures  removed:  5 Post-procedure details:    Post-removal:  No dressing applied   Patient tolerance of procedure:  Tolerated well, no immediate complications   (including critical care time)  Medications Ordered in ED Medications - No data to display   Initial Impression / Assessment and Plan / ED Course  I have reviewed the triage vital signs and the nursing notes.  Pertinent labs & imaging results that were available during my care of the patient were reviewed by me and considered in my medical decision making (see chart for details).        Patient presents for suture removal.  Afebrile, vital signs are stable.  Nontoxic in appearance.  Neurovascularly intact.  Good signs of wound healing, no signs of secondary skin infection.  She has been compliant with antibiotic and I encouraged her to can take the antibiotic to completion.  We discussed scar minimization and wound care.  Discussed her ED return precautions Pt verbalized understanding of and agreement with plan and is safe for discharge home at this time.   Final Clinical Impressions(s) / ED Diagnoses   Final diagnoses:  Visit for suture removal    ED Discharge Orders    None       Debroah Baller 07/30/18 1954    Virgel Manifold, MD 07/31/18 1501

## 2018-07-30 NOTE — Discharge Instructions (Signed)
Continue to take antibiotics to completion.  Apply antibiotic ointment or scar cream to wounds.  You can take ibuprofen or Tylenol as needed for aches or pains.  Return to the emergency department if any concerning signs or symptoms develop such as weakness, fevers, severe swelling or abnormal drainage, streaking of redness up the arm.

## 2018-07-30 NOTE — ED Notes (Signed)
Patient Alert and oriented to baseline. Stable and ambulatory to baseline. Patient verbalized understanding of the discharge instructions.  Patient belongings were taken by the patient.   

## 2019-01-16 ENCOUNTER — Emergency Department (HOSPITAL_COMMUNITY)
Admission: EM | Admit: 2019-01-16 | Discharge: 2019-01-16 | Disposition: A | Discharge status: Home / Self Care | Payer: Self-pay | Attending: Emergency Medicine | Admitting: Emergency Medicine

## 2019-01-16 ENCOUNTER — Encounter (HOSPITAL_COMMUNITY): Payer: Self-pay | Admitting: Emergency Medicine

## 2019-01-16 ENCOUNTER — Other Ambulatory Visit: Payer: Self-pay

## 2019-01-16 ENCOUNTER — Emergency Department (HOSPITAL_COMMUNITY): Payer: Self-pay

## 2019-01-16 DIAGNOSIS — R079 Chest pain, unspecified: Secondary | ICD-10-CM | POA: Insufficient documentation

## 2019-01-16 DIAGNOSIS — F17200 Nicotine dependence, unspecified, uncomplicated: Secondary | ICD-10-CM | POA: Insufficient documentation

## 2019-01-16 DIAGNOSIS — Z79899 Other long term (current) drug therapy: Secondary | ICD-10-CM | POA: Insufficient documentation

## 2019-01-16 LAB — BASIC METABOLIC PANEL
Anion gap: 8 (ref 5–15)
BUN: 8 mg/dL (ref 6–20)
CO2: 25 mmol/L (ref 22–32)
Calcium: 8.9 mg/dL (ref 8.9–10.3)
Chloride: 106 mmol/L (ref 98–111)
Creatinine, Ser: 0.69 mg/dL (ref 0.44–1.00)
GFR calc Af Amer: >60 mL/min (ref >60)
GFR calc non Af Amer: >60 mL/min (ref >60)
Glucose, Bld: 94 mg/dL (ref 70–99)
Potassium: 3.9 mmol/L (ref 3.5–5.1)
Sodium: 139 mmol/L (ref 135–145)

## 2019-01-16 LAB — CBC
HCT: 39.7 % (ref 36.0–46.0)
Hemoglobin: 12.4 g/dL (ref 12.0–15.0)
MCH: 28.6 pg (ref 26.0–34.0)
MCHC: 31.2 g/dL (ref 30.0–36.0)
MCV: 91.5 fL (ref 80.0–100.0)
Platelets: 192 10*3/uL (ref 150–400)
RBC: 4.34 MIL/uL (ref 3.87–5.11)
RDW: 13.4 % (ref 11.5–15.5)
WBC: 4.4 10*3/uL (ref 4.0–10.5)
nRBC: 0 % (ref 0.0–0.2)

## 2019-01-16 LAB — I-STAT BETA HCG BLOOD, ED (MC, WL, AP ONLY): I-stat hCG, quantitative: <5 m[IU]/mL (ref <5)

## 2019-01-16 LAB — TROPONIN I (HIGH SENSITIVITY): Troponin I (High Sensitivity): <2 ng/L (ref <18)

## 2019-01-16 MED ORDER — SODIUM CHLORIDE 0.9% FLUSH
3.0000 mL | Freq: Once | INTRAVENOUS | Status: AC
  Administered 2019-01-16: 08:00:00 3 mL via INTRAVENOUS

## 2019-01-16 NOTE — ED Triage Notes (Signed)
Patient complaining of left and right chest pain that started two days ago. Patient states that her body aches.

## 2019-01-16 NOTE — ED Provider Notes (Signed)
[facility] DEPT Provider Note   CSN: [id number] Arrival date & time: 01/16/19  [id number]     History   Chief Complaint Chief Complaint  Patient presents with   Chest Pain    HPI Penny Leonard is a 23 y.o. female.  Presented emergency room treatment with chest pain.  Patient states she has had some intermittent episodes of chest pain after the past few days.  States pain started after lifting heavy boxes at work, works for [employer].  Worse with certain movements.  Not associated with exertion.  No associated shortness of breath.  This morning had more severe episode of chest pain, across her chest, moderate in severity, sharp, stabbing.  Lasted for a few minutes and resolve spontaneously.  Has not taken any medication for her pain.  No cough.  No fever.  No leg pain.  No recent periods of immobility, no prior history of DVT/PE.   No tobacco abuse, does smoke marijuana, no family history of coronary artery disease.     HPI  History reviewed. No pertinent past medical history.  Patient Active Problem List   Diagnosis Date Noted   LGA (large for gestational age) fetus affecting management of mother 12/20/2016   Positive GBS test 11/28/2016   Anemia during pregnancy in third trimester 10/03/2016   Maternal varicella, non-immune 08/03/2016   Supervision of other normal pregnancy, antepartum 08/01/2016   SVD (spontaneous vaginal delivery) 07/18/2014    Past Surgical History:  Procedure Laterality Date   WRIST SURGERY       OB History    Gravida  2   Para  2   Term  2   Preterm      AB      Living  2     SAB      TAB      Ectopic      Multiple  0   Live Births  2            Home Medications    Prior to Admission medications   Medication Sig Start Date End Date Taking? Authorizing Provider  doxycycline (VIBRAMYCIN) 100 MG capsule Take 1 capsule (100 mg total) by mouth 2 (two) times daily. 07/17/18   Lorin Glass,  PA-C  ibuprofen (ADVIL,MOTRIN) 600 MG tablet Take 1 tablet (600 mg total) every 6 (six) hours as needed by mouth. Patient not taking: Reported on 07/07/2018 12/22/16   Nuala Alpha, DO  Prenatal-DSS-FeCb-FeGl-FA (CITRANATAL BLOOM) 90-1 MG TABS Take 1 tablet by mouth daily. Patient not taking: Reported on 07/07/2018 10/03/16   Kandis Cocking A, CNM  senna-docusate (SENOKOT-S) 8.6-50 MG tablet Take 2 tablets at bedtime as needed by mouth for mild constipation. Patient not taking: Reported on 07/07/2018 12/22/16   Nuala Alpha, DO    Family History Family History  Problem Relation Age of Onset   Diabetes Father     Social History Social History   Tobacco Use   Smoking status: Light Tobacco Smoker   Smokeless tobacco: Never Used  Substance Use Topics   Alcohol use: No    Comment: occassional when not pregnant    Drug use: Yes    Types: Marijuana    Comment: last used 2 weeks ago     Allergies   Patient has no known allergies.   Review of Systems Review of Systems  Constitutional: Negative for chills and fever.  HENT: Negative for ear pain and sore throat.   Eyes: Negative for pain  and visual disturbance.  Respiratory: Negative for cough and shortness of breath.   Cardiovascular: Positive for chest pain. Negative for palpitations.  Gastrointestinal: Negative for abdominal pain and vomiting.  Genitourinary: Negative for dysuria and hematuria.  Musculoskeletal: Negative for arthralgias and back pain.  Skin: Negative for color change and rash.  Neurological: Negative for seizures and syncope.  All other systems reviewed and are negative.    Physical Exam Updated Vital Signs BP 118/81 (BP Location: Left Arm)    Pulse 75    Temp 98.6 F (37 C) (Oral)    Resp 18    Ht 5\' 6"  (1.676 m)    Wt 83.9 kg    LMP 01/09/2019    SpO2 100%    BMI 29.86 kg/m   Physical Exam Vitals signs and nursing note reviewed.  Constitutional:      General: She is not in acute  distress.    Appearance: She is well-developed.  HENT:     Head: Normocephalic and atraumatic.  Eyes:     Conjunctiva/sclera: Conjunctivae normal.  Neck:     Musculoskeletal: Neck supple.  Cardiovascular:     Rate and Rhythm: Normal rate and regular rhythm.     Heart sounds: No murmur.  Pulmonary:     Effort: Pulmonary effort is normal. No respiratory distress.     Breath sounds: Normal breath sounds.  Abdominal:     Palpations: Abdomen is soft.     Tenderness: There is no abdominal tenderness.  Musculoskeletal:     Right lower leg: No edema.     Left lower leg: No edema.  Skin:    General: Skin is warm and dry.     Capillary Refill: Capillary refill takes less than 2 seconds.  Neurological:     General: No focal deficit present.     Mental Status: She is alert.      ED Treatments / Results  Labs (all labs ordered are listed, but only abnormal results are displayed) Labs Reviewed  BASIC METABOLIC PANEL  CBC  I-STAT BETA HCG BLOOD, ED ([facility], [facility], [facility] ONLY)  TROPONIN I (HIGH SENSITIVITY)  TROPONIN I (HIGH SENSITIVITY)    EKG EKG Interpretation  Date/Time:  Wednesday January 16 2019 06:43:40 EST Ventricular Rate:  82 PR Interval:    QRS Duration: 89 QT Interval:  351 QTC Calculation: 410 R Axis:   95 Text Interpretation: Sinus rhythm Borderline right axis deviation no acute STEMI no prior for comparison Confirmed by Madalyn Rob [phone]) on 01/16/2019 7:17:11 AM   Radiology Dg Chest 2 View  Result Date: 01/16/2019 CLINICAL DATA:  Chest pain EXAM: CHEST - 2 VIEW COMPARISON:  None. FINDINGS: Lungs are clear. Heart size and pulmonary vascularity are normal. No adenopathy. No pneumothorax. No bone lesions. IMPRESSION: No abnormality noted. Electronically Signed   By: Lowella Grip III M.D.   On: 01/16/2019 06:56    Procedures Procedures (including critical care time)  Medications Ordered in ED Medications  sodium chloride flush (NS) 0.9 % injection 3 mL (3  mLs Intravenous Given 01/16/19 0738)     Initial Impression / Assessment and Plan / ED Course  I have reviewed the triage vital signs and the nursing notes.  Pertinent labs & imaging results that were available during my care of the patient were reviewed by me and considered in my medical decision making (see chart for details).        23 year old lady presents to ER with intermittent episodes of chest pain.  Here patient is well-appearing, no ongoing pain.  She has normal vital signs, no risk factors for PE, no risk factors for coronary artery disease.  Normal EKG, troponin undetectable, doubt ACS.  Chest x-ray negative for acute pulmonary pathology.  She has no tachypnea, no hypoxia, no tachycardia, doubt PE.  Labs otherwise within normal limits.  On reassessment patient remains asymptomatic and well-appearing.  Given above work-up, believe patient is appropriate for discharge and outpatient management at this time.  Recommend follow-up with PCP.  Reviewed return precautions, will discharge home.    After the discussed management above, the patient was determined to be safe for discharge.  The patient was in agreement with this plan and all questions regarding their care were answered.  ED return precautions were discussed and the patient will return to the ED with any significant worsening of condition.    Final Clinical Impressions(s) / ED Diagnoses   Final diagnoses:  Chest pain, unspecified type    ED Discharge Orders    None       Lucrezia Starch, MD 01/16/19 [phone]

## 2019-01-16 NOTE — Discharge Instructions (Signed)
Recommend Tylenol, Motrin as needed for pain control.  If you develop worsening pain, difficulty breathing, other new concerning symptom, please return to ER for reassessment.  Otherwise recommend follow-up with your primary doctor next week for close recheck.

## 2020-08-26 IMAGING — CR LEFT HAND - 2 VIEW
2 series · 2 of 2 positions shown · non-contrast
Comparison: None.

CLINICAL DATA: Laceration

EXAM:
LEFT HAND - 2 VIEW

[hand pa]
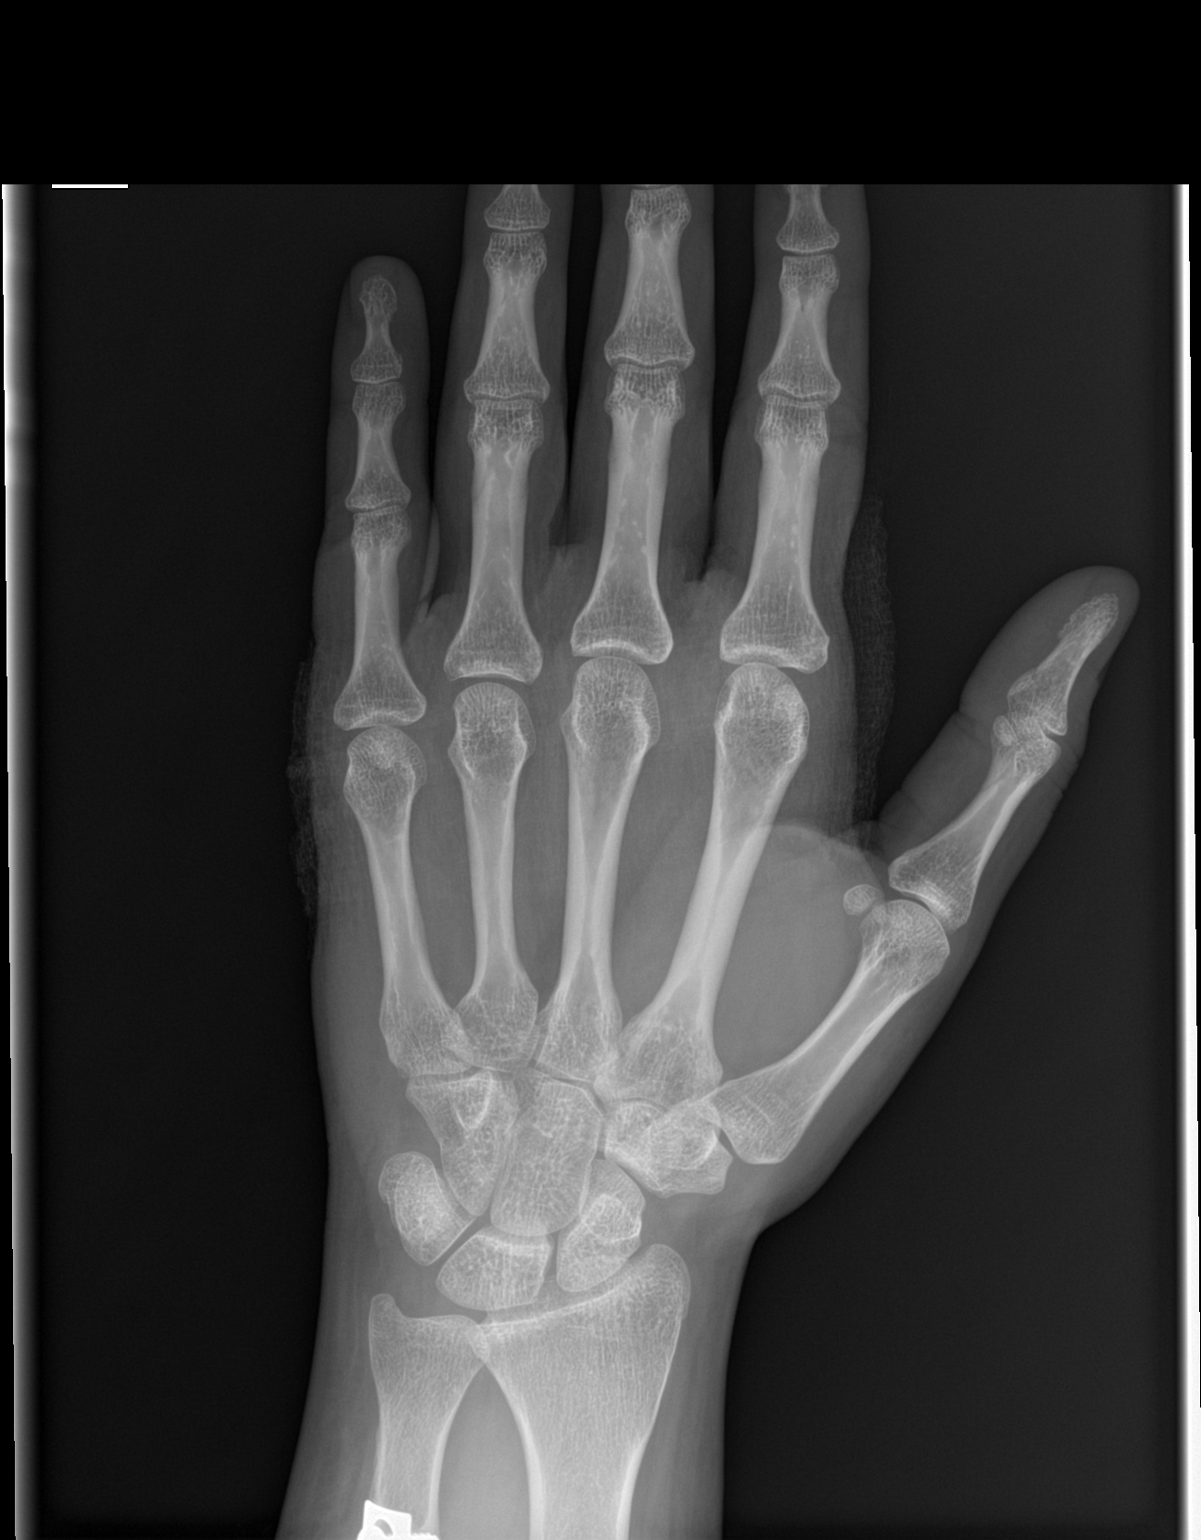

[hand lat]
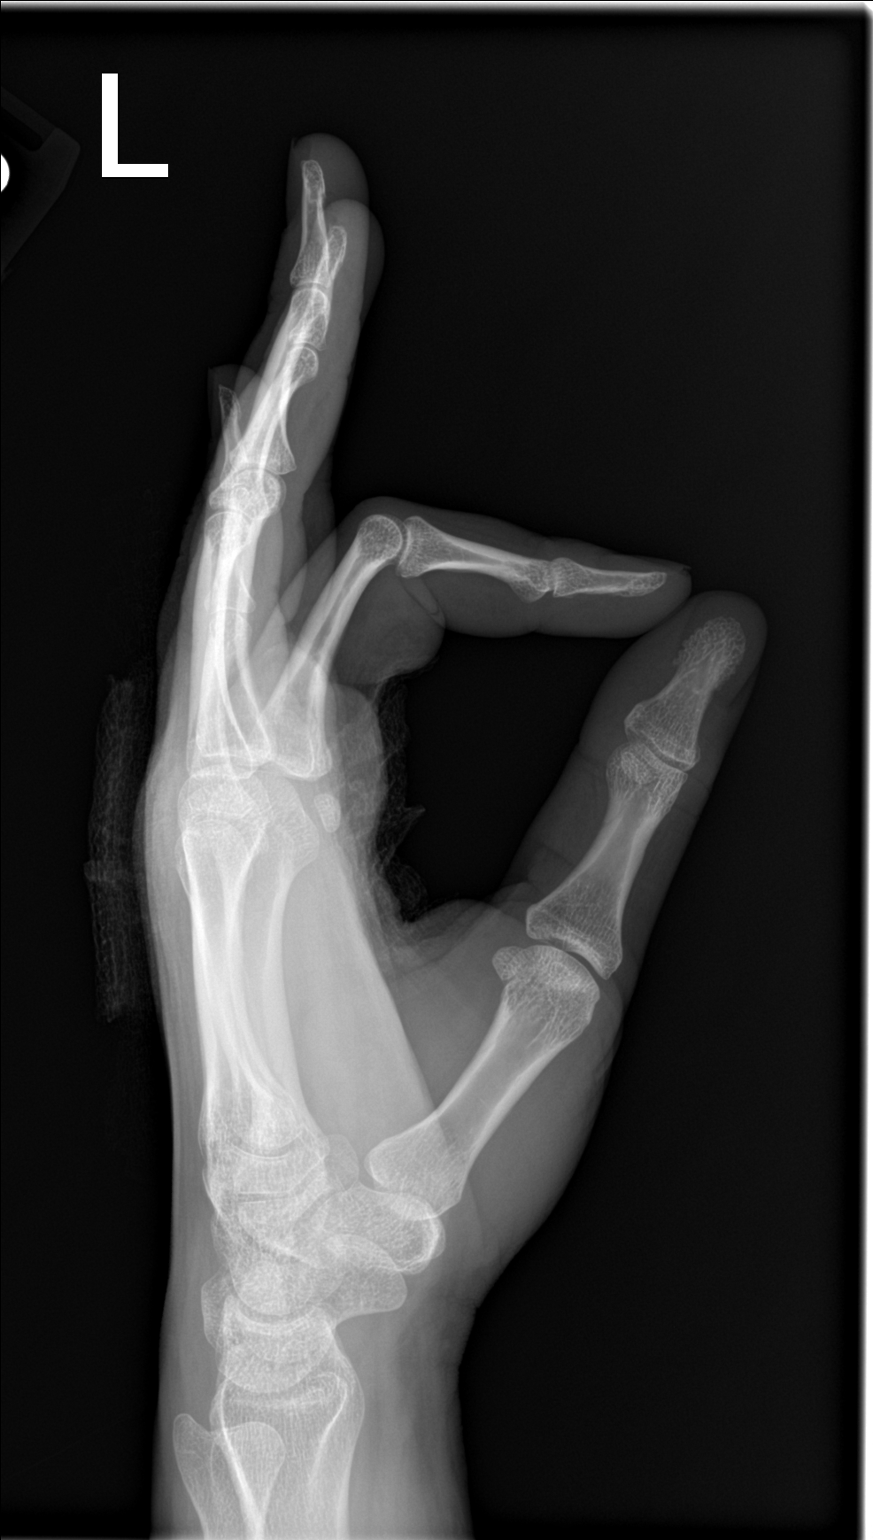

[2 of 2 positions shown; findings below may reference images not displayed]

FINDINGS: There is no acute displaced fracture or dislocation. Soft tissue
swelling is noted. There is no radiopaque foreign body.
IMPRESSION: 1. No acute osseous abnormality.
2. Nonspecific soft tissue swelling about the dorsal aspect of the
hand.

## 2021-07-10 ENCOUNTER — Emergency Department (HOSPITAL_COMMUNITY)
Admission: EM | Admit: 2021-07-10 | Discharge: 2021-07-10 | Disposition: A | Discharge status: Home / Self Care | Attending: Emergency Medicine | Admitting: Emergency Medicine

## 2021-07-10 ENCOUNTER — Encounter (HOSPITAL_COMMUNITY): Payer: Self-pay

## 2021-07-10 ENCOUNTER — Other Ambulatory Visit: Payer: Self-pay

## 2021-07-10 DIAGNOSIS — F10120 Alcohol abuse with intoxication, uncomplicated: Secondary | ICD-10-CM | POA: Diagnosis not present

## 2021-07-10 DIAGNOSIS — R1013 Epigastric pain: Secondary | ICD-10-CM | POA: Diagnosis present

## 2021-07-10 DIAGNOSIS — R5383 Other fatigue: Secondary | ICD-10-CM | POA: Diagnosis not present

## 2021-07-10 DIAGNOSIS — R110 Nausea: Secondary | ICD-10-CM

## 2021-07-10 DIAGNOSIS — Y900 Blood alcohol level of less than 20 mg/100 ml: Secondary | ICD-10-CM | POA: Insufficient documentation

## 2021-07-10 LAB — COMPREHENSIVE METABOLIC PANEL
ALT: 19 U/L (ref 0–44)
AST: 22 U/L (ref 15–41)
Albumin: 4.3 g/dL (ref 3.5–5.0)
Alkaline Phosphatase: 70 U/L (ref 38–126)
Anion gap: 7 (ref 5–15)
BUN: 5 mg/dL — ABNORMAL LOW (ref 6–20)
CO2: 29 mmol/L (ref 22–32)
Calcium: 9.4 mg/dL (ref 8.9–10.3)
Chloride: 108 mmol/L (ref 98–111)
Creatinine, Ser: 0.75 mg/dL (ref 0.44–1.00)
GFR, Estimated: >60 mL/min (ref >60)
Glucose, Bld: 90 mg/dL (ref 70–99)
Potassium: 3.3 mmol/L — ABNORMAL LOW (ref 3.5–5.1)
Sodium: 144 mmol/L (ref 135–145)
Total Bilirubin: 0.5 mg/dL (ref 0.3–1.2)
Total Protein: 7.7 g/dL (ref 6.5–8.1)

## 2021-07-10 LAB — CBC WITH DIFFERENTIAL/PLATELET
Abs Immature Granulocytes: 0.02 10*3/uL (ref 0.00–0.07)
Basophils Absolute: 0 10*3/uL (ref 0.0–0.1)
Basophils Relative: 0 %
Eosinophils Absolute: 0.2 10*3/uL (ref 0.0–0.5)
Eosinophils Relative: 3 %
HCT: 39.7 % (ref 36.0–46.0)
Hemoglobin: 12.7 g/dL (ref 12.0–15.0)
Immature Granulocytes: 0 %
Lymphocytes Relative: 39 %
Lymphs Abs: 2.7 10*3/uL (ref 0.7–4.0)
MCH: 28.2 pg (ref 26.0–34.0)
MCHC: 32 g/dL (ref 30.0–36.0)
MCV: 88 fL (ref 80.0–100.0)
Monocytes Absolute: 0.5 10*3/uL (ref 0.1–1.0)
Monocytes Relative: 8 %
Neutro Abs: 3.5 10*3/uL (ref 1.7–7.7)
Neutrophils Relative %: 50 %
Platelets: 298 10*3/uL (ref 150–400)
RBC: 4.51 MIL/uL (ref 3.87–5.11)
RDW: 13.3 % (ref 11.5–15.5)
WBC: 7 10*3/uL (ref 4.0–10.5)
nRBC: 0 % (ref 0.0–0.2)

## 2021-07-10 LAB — ETHANOL: Alcohol, Ethyl (B): <10 mg/dL (ref <10)

## 2021-07-10 LAB — RAPID URINE DRUG SCREEN, HOSP PERFORMED
Amphetamines: NOT DETECTED
Barbiturates: NOT DETECTED
Benzodiazepines: NOT DETECTED
Cocaine: NOT DETECTED
Opiates: NOT DETECTED
Tetrahydrocannabinol: POSITIVE — AB

## 2021-07-10 LAB — I-STAT BETA HCG BLOOD, ED (MC, WL, AP ONLY): I-stat hCG, quantitative: <5 m[IU]/mL (ref <5)

## 2021-07-10 MED ORDER — ALUM & MAG HYDROXIDE-SIMETH 200-200-20 MG/5ML PO SUSP
30.0000 mL | Freq: Once | ORAL | Status: AC
  Administered 2021-07-10: 30 mL via ORAL
  Filled 2021-07-10: qty 30

## 2021-07-10 MED ORDER — ONDANSETRON HCL 4 MG/2ML IJ SOLN
4.0000 mg | Freq: Once | INTRAMUSCULAR | Status: AC
  Administered 2021-07-10: 4 mg via INTRAVENOUS
  Filled 2021-07-10: qty 2

## 2021-07-10 MED ORDER — POTASSIUM CHLORIDE CRYS ER 20 MEQ PO TBCR
40.0000 meq | EXTENDED_RELEASE_TABLET | Freq: Once | ORAL | Status: AC
  Administered 2021-07-10: 40 meq via ORAL
  Filled 2021-07-10: qty 2

## 2021-07-10 MED ORDER — SODIUM CHLORIDE 0.9 % IV BOLUS
1000.0000 mL | Freq: Once | INTRAVENOUS | Status: AC
  Administered 2021-07-10: 1000 mL via INTRAVENOUS

## 2021-07-10 MED ORDER — ONDANSETRON HCL 4 MG PO TABS: 4.0000 mg | ORAL_TABLET | Freq: Three times a day (TID) | ORAL | 0 refills | Status: AC | PRN

## 2021-07-10 MED ORDER — FAMOTIDINE IN NACL 20-0.9 MG/50ML-% IV SOLN
20.0000 mg | Freq: Once | INTRAVENOUS | Status: AC
  Administered 2021-07-10: 20 mg via INTRAVENOUS
  Filled 2021-07-10: qty 50

## 2021-07-10 MED ORDER — LIDOCAINE VISCOUS HCL 2 % MT SOLN
15.0000 mL | Freq: Once | OROMUCOSAL | Status: AC
  Administered 2021-07-10: 15 mL via ORAL
  Filled 2021-07-10: qty 15

## 2021-07-10 NOTE — ED Notes (Signed)
The pt drank a cup of water without any problems

## 2021-07-10 NOTE — Discharge Instructions (Signed)
You have been seen and discharged from the emergency department.  Take nausea medicine as needed.  Stay well-hydrated.  Follow-up with your primary provider for further evaluation and further care. Take home medications as prescribed. If you have any worsening symptoms or further concerns for your health please return to an emergency department for further evaluation.

## 2021-07-10 NOTE — ED Notes (Signed)
The pt reports that she is jittery after drinking too much last pm a and o x 4  blood sent for an I stat

## 2021-07-10 NOTE — ED Triage Notes (Signed)
Pt arrived POV from home c/o having a bad hangover after drinking all night. Pt states she is seeing stars and has been nauseous and weak.

## 2021-07-10 NOTE — ED Provider Notes (Signed)
[facility] EMERGENCY DEPARTMENT Provider Note   CSN: [id number] Arrival date & time: 07/10/21  1258     History  Chief Complaint  Patient presents with   Alcohol Intoxication    Penny Leonard is a 26 y.o. female.  HPI  26 year old female presents emergency department after concern of drinking too much last night.  She admits to drinking copious amounts of tequila.  Woke up this morning with epigastric discomfort, nausea/vomiting.  She feels fatigued, nauseous and overly weak.  States that she typically does not get like this when she is hung over.  Denies any other acute illness, chest pain, fever, shortness of breath.  Home Medications Prior to Admission medications   Medication Sig Start Date End Date Taking? Authorizing Provider  doxycycline (VIBRAMYCIN) 100 MG capsule Take 1 capsule (100 mg total) by mouth 2 (two) times daily. 07/17/18   Lorin Glass, PA-C  ibuprofen (ADVIL,MOTRIN) 600 MG tablet Take 1 tablet (600 mg total) every 6 (six) hours as needed by mouth. Patient not taking: Reported on 07/07/2018 12/22/16   Nuala Alpha, MD  Prenatal-DSS-FeCb-FeGl-FA (CITRANATAL BLOOM) 90-1 MG TABS Take 1 tablet by mouth daily. Patient not taking: Reported on 07/07/2018 10/03/16   Kandis Cocking A, CNM  senna-docusate (SENOKOT-S) 8.6-50 MG tablet Take 2 tablets at bedtime as needed by mouth for mild constipation. Patient not taking: Reported on 07/07/2018 12/22/16   Nuala Alpha, MD      Allergies    Patient has no known allergies.    Review of Systems   Review of Systems  Constitutional:  Positive for fatigue. Negative for fever.  Respiratory:  Negative for shortness of breath.   Cardiovascular:  Negative for chest pain and leg swelling.  Gastrointestinal:  Positive for abdominal pain and nausea. Negative for diarrhea and vomiting.  Skin:  Negative for rash.  Neurological:  Negative for weakness, numbness and headaches.   Physical Exam Updated  Vital Signs BP 121/85   Pulse 74   Temp 98.1 F (36.7 C) (Oral)   Resp 18   Ht '5\' 6"'$  (1.676 m)   Wt 83.9 kg   SpO2 98%   BMI 29.86 kg/m  Physical Exam Vitals and nursing note reviewed.  Constitutional:      General: She is not in acute distress.    Appearance: Normal appearance. She is not ill-appearing.  HENT:     Head: Normocephalic.     Mouth/Throat:     Mouth: Mucous membranes are moist.  Cardiovascular:     Rate and Rhythm: Normal rate.  Pulmonary:     Effort: Pulmonary effort is normal. No respiratory distress.  Abdominal:     Palpations: Abdomen is soft.     Tenderness: There is no abdominal tenderness. There is no guarding or rebound.  Skin:    General: Skin is warm.  Neurological:     Mental Status: She is alert and oriented to person, place, and time. Mental status is at baseline.  Psychiatric:        Mood and Affect: Mood normal.    ED Results / Procedures / Treatments   Labs (all labs ordered are listed, but only abnormal results are displayed) Labs Reviewed  COMPREHENSIVE METABOLIC PANEL - Abnormal; Notable for the following components:      Result Value   Potassium 3.3 (*)    BUN 5 (*)    All other components within normal limits  ETHANOL  CBC WITH DIFFERENTIAL/PLATELET  RAPID URINE DRUG SCREEN,  HOSP PERFORMED  I-STAT BETA HCG BLOOD, ED ([facility], [facility], [facility] ONLY)    EKG None  Radiology No results found.  Procedures Procedures    Medications Ordered in ED Medications  sodium chloride 0.9 % bolus 1,000 mL (0 mLs Intravenous Stopped 07/10/21 1539)  ondansetron (ZOFRAN) injection 4 mg (4 mg Intravenous Given 07/10/21 1459)  potassium chloride SA (KLOR-CON M) CR tablet 40 mEq (40 mEq Oral Given 07/10/21 1556)  famotidine (PEPCID) IVPB 20 mg premix (0 mg Intravenous Stopped 07/10/21 1638)  alum & mag hydroxide-simeth (MAALOX/MYLANTA) 200-200-20 MG/5ML suspension 30 mL (30 mLs Oral Given 07/10/21 1553)    And  lidocaine (XYLOCAINE) 2 % viscous mouth  solution 15 mL (15 mLs Oral Given 07/10/21 1553)    ED Course/ Medical Decision Making/ A&P                           Medical Decision Making Risk OTC drugs. Prescription drug management.   26 year old female presents emergency department epigastric abdominal pain, nausea after a night of binge drinking.  She states she typically does not have the symptoms secondary to alcohol use.  Denies any drug use.  Pain does not radiate up into her chest, no shortness of breath.  No recent fever or other acute illness.  Blood work here is reassuring, pregnancy test is negative.  After medication patient feels significantly improved, she is been able to p.o.  Plan for outpatient follow-up.  Patient at this time appears safe and stable for discharge and close outpatient follow up. Discharge plan and strict return to ED precautions discussed, patient verbalizes understanding and agreement.        Final Clinical Impression(s) / ED Diagnoses Final diagnoses:  None    Rx / DC Orders ED Discharge Orders     None         Lorelle Gibbs, DO 07/10/21 1900

## 2022-07-06 ENCOUNTER — Emergency Department
Admission: EM | Admit: 2022-07-06 | Discharge: 2022-07-06 | Disposition: A | Discharge status: Home / Self Care | Attending: Emergency Medicine | Admitting: Emergency Medicine

## 2022-07-06 ENCOUNTER — Encounter: Payer: Self-pay | Admitting: Emergency Medicine

## 2022-07-06 ENCOUNTER — Emergency Department

## 2022-07-06 ENCOUNTER — Other Ambulatory Visit: Payer: Self-pay

## 2022-07-06 DIAGNOSIS — S298XXA Other specified injuries of thorax, initial encounter: Secondary | ICD-10-CM

## 2022-07-06 DIAGNOSIS — Y92410 Unspecified street and highway as the place of occurrence of the external cause: Secondary | ICD-10-CM | POA: Diagnosis not present

## 2022-07-06 DIAGNOSIS — S299XXA Unspecified injury of thorax, initial encounter: Secondary | ICD-10-CM | POA: Insufficient documentation

## 2022-07-06 DIAGNOSIS — M791 Myalgia, unspecified site: Secondary | ICD-10-CM | POA: Diagnosis not present

## 2022-07-06 LAB — CBC
HCT: 39.2 % (ref 36.0–46.0)
Hemoglobin: 12.6 g/dL (ref 12.0–15.0)
MCH: 28.8 pg (ref 26.0–34.0)
MCHC: 32.1 g/dL (ref 30.0–36.0)
MCV: 89.5 fL (ref 80.0–100.0)
Platelets: 219 10*3/uL (ref 150–400)
RBC: 4.38 MIL/uL (ref 3.87–5.11)
RDW: 12.7 % (ref 11.5–15.5)
WBC: 8.7 10*3/uL (ref 4.0–10.5)
nRBC: 0 % (ref 0.0–0.2)

## 2022-07-06 LAB — BASIC METABOLIC PANEL
Anion gap: 5 (ref 5–15)
BUN: 11 mg/dL (ref 6–20)
CO2: 21 mmol/L — ABNORMAL LOW (ref 22–32)
Calcium: 8.7 mg/dL — ABNORMAL LOW (ref 8.9–10.3)
Chloride: 112 mmol/L — ABNORMAL HIGH (ref 98–111)
Creatinine, Ser: 0.63 mg/dL (ref 0.44–1.00)
GFR, Estimated: >60 mL/min (ref >60)
Glucose, Bld: 88 mg/dL (ref 70–99)
Potassium: 3.6 mmol/L (ref 3.5–5.1)
Sodium: 138 mmol/L (ref 135–145)

## 2022-07-06 LAB — TROPONIN I (HIGH SENSITIVITY): Troponin I (High Sensitivity): 3 ng/L (ref <18)
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
## 2022-08-07 ENCOUNTER — Other Ambulatory Visit: Payer: Self-pay

## 2022-08-07 ENCOUNTER — Encounter (HOSPITAL_COMMUNITY): Payer: Self-pay | Admitting: *Deleted

## 2022-08-07 ENCOUNTER — Ambulatory Visit (HOSPITAL_COMMUNITY)
Admission: EM | Admit: 2022-08-07 | Discharge: 2022-08-07 | Disposition: A | Discharge status: Home / Self Care | Attending: Internal Medicine | Admitting: Internal Medicine

## 2022-08-07 DIAGNOSIS — M5442 Lumbago with sciatica, left side: Secondary | ICD-10-CM

## 2022-08-07 MED ORDER — PREDNISONE 20 MG PO TABS: 40.0000 mg | ORAL_TABLET | Freq: Every day | ORAL | 0 refills | Status: AC

## 2022-08-07 MED ORDER — DEXAMETHASONE SODIUM PHOSPHATE 10 MG/ML IJ SOLN
10.0000 mg | Freq: Once | INTRAMUSCULAR | Status: AC
  Administered 2022-08-07: 10 mg via INTRAMUSCULAR

## 2022-08-07 MED ORDER — BACLOFEN 10 MG PO TABS: 10.0000 mg | ORAL_TABLET | Freq: Three times a day (TID) | ORAL | 0 refills | Status: AC

## 2022-08-07 MED ORDER — DEXAMETHASONE SODIUM PHOSPHATE 10 MG/ML IJ SOLN
INTRAMUSCULAR | Status: AC
  Filled 2022-08-07: qty 1

## 2022-08-07 NOTE — Discharge Instructions (Addendum)
Your low back pain is due to sciatic nerve pain. I gave you a steroid shot in the clinic to help reduce inflammation and pain. Start taking prednisone once daily for the next 5 days starting tomorrow.  Take this with food to avoid stomach upset. Take muscle relaxer at bedtime as needed for muscle spasm.  No that the muscle relaxer may make you sleepy, so do not take this during the daytime or when drinking/driving.  Apply heat to the low back and use gentle range of motion exercises to prevent stiffness to the area.  Please schedule an appointment for follow-up with your primary care provider or the orthopedic provider listed on your paperwork.   If you develop any new or worsening symptoms or do not improve in the next 2 to 3 days, please return.  If your symptoms are severe, please go to the emergency room.  Follow-up with your primary care provider for further evaluation and management of your symptoms as well as ongoing wellness visits.  I hope you feel better!
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
## 2023-03-31 ENCOUNTER — Emergency Department (HOSPITAL_COMMUNITY)
Admission: EM | Admit: 2023-03-31 | Discharge: 2023-03-31 | Discharge status: Against Medical Advice | Payer: Self-pay | Attending: Emergency Medicine | Admitting: Emergency Medicine

## 2023-03-31 DIAGNOSIS — R112 Nausea with vomiting, unspecified: Secondary | ICD-10-CM | POA: Insufficient documentation

## 2023-03-31 DIAGNOSIS — Z5321 Procedure and treatment not carried out due to patient leaving prior to being seen by health care provider: Secondary | ICD-10-CM | POA: Insufficient documentation

## 2023-03-31 NOTE — ED Notes (Signed)
Called for pt 3 times with no response.
